# Patient Record
Sex: Male | Born: 1935 | Race: White | Hispanic: No | Marital: Married | Smoking: Never smoker
Health system: Southern US, Community
[De-identification: ages and names within clinical notes are randomized; demographics above are authoritative.]

## PROBLEM LIST (undated history)

## (undated) DIAGNOSIS — Z87448 Personal history of other diseases of urinary system: Secondary | ICD-10-CM

## (undated) DIAGNOSIS — I10 Essential (primary) hypertension: Secondary | ICD-10-CM

## (undated) DIAGNOSIS — N400 Enlarged prostate without lower urinary tract symptoms: Secondary | ICD-10-CM

## (undated) DIAGNOSIS — Z87898 Personal history of other specified conditions: Secondary | ICD-10-CM

## (undated) HISTORY — PX: TONSILLECTOMY: SUR1361

---

## 1975-07-11 HISTORY — PX: INGUINAL HERNIA REPAIR: SUR1180

## 2011-11-08 DIAGNOSIS — R7309 Other abnormal glucose: Secondary | ICD-10-CM | POA: Diagnosis not present

## 2011-11-08 DIAGNOSIS — R030 Elevated blood-pressure reading, without diagnosis of hypertension: Secondary | ICD-10-CM | POA: Diagnosis not present

## 2011-11-08 DIAGNOSIS — E785 Hyperlipidemia, unspecified: Secondary | ICD-10-CM | POA: Diagnosis not present

## 2011-11-08 DIAGNOSIS — N401 Enlarged prostate with lower urinary tract symptoms: Secondary | ICD-10-CM | POA: Diagnosis not present

## 2011-11-08 DIAGNOSIS — R7301 Impaired fasting glucose: Secondary | ICD-10-CM | POA: Diagnosis not present

## 2011-11-08 DIAGNOSIS — K219 Gastro-esophageal reflux disease without esophagitis: Secondary | ICD-10-CM | POA: Diagnosis not present

## 2012-04-01 DIAGNOSIS — R404 Transient alteration of awareness: Secondary | ICD-10-CM | POA: Diagnosis not present

## 2012-04-01 DIAGNOSIS — R42 Dizziness and giddiness: Secondary | ICD-10-CM | POA: Diagnosis not present

## 2012-04-01 DIAGNOSIS — I6200 Nontraumatic subdural hemorrhage, unspecified: Secondary | ICD-10-CM | POA: Diagnosis not present

## 2012-04-01 DIAGNOSIS — N400 Enlarged prostate without lower urinary tract symptoms: Secondary | ICD-10-CM | POA: Diagnosis present

## 2012-04-01 DIAGNOSIS — I498 Other specified cardiac arrhythmias: Secondary | ICD-10-CM | POA: Diagnosis present

## 2012-04-01 DIAGNOSIS — R5381 Other malaise: Secondary | ICD-10-CM | POA: Diagnosis not present

## 2012-04-01 DIAGNOSIS — R55 Syncope and collapse: Secondary | ICD-10-CM | POA: Diagnosis not present

## 2012-04-01 DIAGNOSIS — M6281 Muscle weakness (generalized): Secondary | ICD-10-CM

## 2012-04-01 DIAGNOSIS — Z2821 Immunization not carried out because of patient refusal: Secondary | ICD-10-CM | POA: Diagnosis not present

## 2012-04-01 DIAGNOSIS — E7800 Pure hypercholesterolemia, unspecified: Secondary | ICD-10-CM | POA: Diagnosis present

## 2012-04-01 DIAGNOSIS — S065X0A Traumatic subdural hemorrhage without loss of consciousness, initial encounter: Secondary | ICD-10-CM | POA: Diagnosis not present

## 2012-04-01 DIAGNOSIS — Z79899 Other long term (current) drug therapy: Secondary | ICD-10-CM | POA: Diagnosis not present

## 2012-04-02 DIAGNOSIS — R55 Syncope and collapse: Secondary | ICD-10-CM

## 2012-04-09 DIAGNOSIS — R300 Dysuria: Secondary | ICD-10-CM | POA: Diagnosis not present

## 2012-04-09 DIAGNOSIS — I951 Orthostatic hypotension: Secondary | ICD-10-CM | POA: Diagnosis not present

## 2012-04-09 DIAGNOSIS — N401 Enlarged prostate with lower urinary tract symptoms: Secondary | ICD-10-CM | POA: Diagnosis not present

## 2012-04-09 DIAGNOSIS — R55 Syncope and collapse: Secondary | ICD-10-CM | POA: Diagnosis not present

## 2012-04-15 ENCOUNTER — Encounter: Payer: Self-pay | Admitting: Internal Medicine

## 2012-04-15 ENCOUNTER — Ambulatory Visit (INDEPENDENT_AMBULATORY_CARE_PROVIDER_SITE_OTHER): Payer: Medicare Other | Admitting: Internal Medicine

## 2012-04-15 VITALS — BP 150/72 | HR 50 | Ht 72.0 in | Wt 237.1 lb

## 2012-04-15 DIAGNOSIS — I498 Other specified cardiac arrhythmias: Secondary | ICD-10-CM

## 2012-04-15 DIAGNOSIS — R001 Bradycardia, unspecified: Secondary | ICD-10-CM

## 2012-04-15 DIAGNOSIS — R55 Syncope and collapse: Secondary | ICD-10-CM

## 2012-04-15 NOTE — Patient Instructions (Addendum)
Your physician recommends that you schedule a follow-up appointment in: As needed  

## 2012-04-15 NOTE — Progress Notes (Signed)
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   Worked for 3 hours  Felt OK  Came in house  Showered.  Felt OK  Went to [facility] down  Sat down for about 1 hour.  Watched TV.  Stood up  Didn't feel dizzy.  Foot started shaking (R)  Moved up both legs  upt to top of head.  Felt like energy, strenght went out of body.  Next thing found himself leaning on kitchen table sliding off.  When woke up again was flat on floor.  Breathing OK  No nausea.  Call 911. EMS sat him up  Head started spinning.  Went away.  Stood  Did again, not as severe as first time. Went to ER  Stayed until wed.  Echo done  OK R/O for MI  Stopped Flomax.  Walked halls  Not dizzy.  Switched to proscar    Since left hosp felt a little weak  Walking per day  Felt a little swimmy headed   Last week wed went out  Had red meat for dinner.  Came home.  Gassy after  Took pepcid  No relief.  Stomach bloated.  Took peragoric  Gas went away  Rash developed Did have a rash last week Wed.   Itchy  Now gone.  Still swimmy headed when quit exercising.  NOt when exercising.  Stopped Proscar last week.  Has not been swimmy headed since.     No Known Allergies  Current Outpatient Prescriptions  Medication Sig Dispense Refill  . finasteride (PROSCAR) 5 MG tablet Take 5 mg by mouth daily.      . pravastatin (PRAVACHOL) 20 MG tablet Take 20 mg by mouth daily.        No past medical history on file.  No past surgical history on file.  No family history on file.  History   Social History  . Marital Status: Married    Spouse Name: N/A    Number of Children: N/A  . Years of Education: N/A   Occupational History  . Not on file.   Social History Main Topics  . Smoking status: Never Smoker   . Smokeless tobacco: Not on file  . Alcohol Use: Not on file  . Drug Use: Not on file  . Sexually Active: Not on file   Other Topics Concern  . Not on file   Social  History Narrative  . No narrative on file    Review of Systems:  All systems reviewed.  They are negative to the above problem except as previously stated.  Vital Signs: BP 150/72  Pulse 50  Ht 6' (1.829 m)  Wt 237 lb 1.9 oz (107.557 kg)  BMI 32.16 kg/m2  Physical Exam Patient is in NAD HEENT:  Normocephalic, atraumatic. EOMI, PERRLA.  Neck: JVP is normal.  No bruits.  Lungs: clear to auscultation. No rales no wheezes.  Heart: Regular rate and rhythm. Normal S1, S2. No S3.   No significant murmurs. PMI not displaced.  Abdomen:  Supple, nontender. Normal bowel sounds. No masses. No hepatomegaly.  Extremities:   Good distal pulses throughout. No lower extremity edema.  Musculoskeletal :moving all extremities.  Neuro:   alert and oriented x3.  CN II-XII grossly intact.  EKG:  Sinus bradycardia  50 bpm Assessment and Plan:  1.  Syncope.  Episode of syncope a couple wks ago.  Sounds like  it may have been do to a little dehydration with orthostasis (post shower, sitting) in setting of Flomax.  Felt a little swimmy on Proscar.  None since off meds.  Plans to rechallange tomorrow  I am not convinced of symptomatic bradycardia.  He was on tele at [facility] and there is no mention in note.  Told him to be careful with rechallange of Proscar.  He may need to see urology.  I would not plan further cardiac testing unless symptoms recur off meds.  Will be available prn.

## 2012-05-15 DIAGNOSIS — N401 Enlarged prostate with lower urinary tract symptoms: Secondary | ICD-10-CM | POA: Diagnosis not present

## 2012-05-15 DIAGNOSIS — R7309 Other abnormal glucose: Secondary | ICD-10-CM | POA: Diagnosis not present

## 2012-05-15 DIAGNOSIS — N509 Disorder of male genital organs, unspecified: Secondary | ICD-10-CM | POA: Diagnosis not present

## 2012-05-15 DIAGNOSIS — I10 Essential (primary) hypertension: Secondary | ICD-10-CM | POA: Diagnosis not present

## 2012-05-15 DIAGNOSIS — E785 Hyperlipidemia, unspecified: Secondary | ICD-10-CM | POA: Diagnosis not present

## 2012-05-15 DIAGNOSIS — E782 Mixed hyperlipidemia: Secondary | ICD-10-CM | POA: Diagnosis not present

## 2012-05-16 DIAGNOSIS — R030 Elevated blood-pressure reading, without diagnosis of hypertension: Secondary | ICD-10-CM | POA: Diagnosis not present

## 2012-05-16 DIAGNOSIS — E669 Obesity, unspecified: Secondary | ICD-10-CM | POA: Diagnosis not present

## 2012-05-16 DIAGNOSIS — Z0000 Encounter for general adult medical examination without abnormal findings: Secondary | ICD-10-CM | POA: Diagnosis not present

## 2012-05-16 DIAGNOSIS — K219 Gastro-esophageal reflux disease without esophagitis: Secondary | ICD-10-CM | POA: Diagnosis not present

## 2012-06-18 DIAGNOSIS — L500 Allergic urticaria: Secondary | ICD-10-CM | POA: Diagnosis not present

## 2012-06-18 DIAGNOSIS — Z79899 Other long term (current) drug therapy: Secondary | ICD-10-CM | POA: Diagnosis not present

## 2012-06-18 DIAGNOSIS — L259 Unspecified contact dermatitis, unspecified cause: Secondary | ICD-10-CM | POA: Diagnosis not present

## 2012-07-10 DIAGNOSIS — N400 Enlarged prostate without lower urinary tract symptoms: Secondary | ICD-10-CM

## 2012-07-10 HISTORY — DX: Benign prostatic hyperplasia without lower urinary tract symptoms: N40.0

## 2012-09-11 DIAGNOSIS — N400 Enlarged prostate without lower urinary tract symptoms: Secondary | ICD-10-CM | POA: Diagnosis not present

## 2012-09-11 DIAGNOSIS — I10 Essential (primary) hypertension: Secondary | ICD-10-CM | POA: Diagnosis not present

## 2012-09-11 DIAGNOSIS — E785 Hyperlipidemia, unspecified: Secondary | ICD-10-CM | POA: Diagnosis not present

## 2012-09-11 DIAGNOSIS — E782 Mixed hyperlipidemia: Secondary | ICD-10-CM | POA: Diagnosis not present

## 2013-03-19 DIAGNOSIS — N401 Enlarged prostate with lower urinary tract symptoms: Secondary | ICD-10-CM | POA: Diagnosis not present

## 2013-03-19 DIAGNOSIS — Z23 Encounter for immunization: Secondary | ICD-10-CM | POA: Diagnosis not present

## 2013-03-19 DIAGNOSIS — E785 Hyperlipidemia, unspecified: Secondary | ICD-10-CM | POA: Diagnosis not present

## 2013-03-19 DIAGNOSIS — R7309 Other abnormal glucose: Secondary | ICD-10-CM | POA: Diagnosis not present

## 2013-03-19 DIAGNOSIS — E782 Mixed hyperlipidemia: Secondary | ICD-10-CM | POA: Diagnosis not present

## 2013-03-19 DIAGNOSIS — I10 Essential (primary) hypertension: Secondary | ICD-10-CM | POA: Diagnosis not present

## 2014-02-09 DIAGNOSIS — R7301 Impaired fasting glucose: Secondary | ICD-10-CM | POA: Diagnosis not present

## 2014-02-09 DIAGNOSIS — R7309 Other abnormal glucose: Secondary | ICD-10-CM | POA: Diagnosis not present

## 2014-02-09 DIAGNOSIS — E785 Hyperlipidemia, unspecified: Secondary | ICD-10-CM | POA: Diagnosis not present

## 2014-02-09 DIAGNOSIS — E782 Mixed hyperlipidemia: Secondary | ICD-10-CM | POA: Diagnosis not present

## 2014-02-09 DIAGNOSIS — M62838 Other muscle spasm: Secondary | ICD-10-CM | POA: Diagnosis not present

## 2014-02-09 DIAGNOSIS — N508 Other specified disorders of male genital organs: Secondary | ICD-10-CM | POA: Diagnosis not present

## 2014-02-10 ENCOUNTER — Other Ambulatory Visit (HOSPITAL_COMMUNITY): Payer: Self-pay | Admitting: Internal Medicine

## 2014-02-10 DIAGNOSIS — N5089 Other specified disorders of the male genital organs: Secondary | ICD-10-CM

## 2014-02-11 ENCOUNTER — Ambulatory Visit (HOSPITAL_COMMUNITY)
Admission: RE | Admit: 2014-02-11 | Discharge: 2014-02-11 | Disposition: A | Discharge status: Home / Self Care | Payer: Medicare Other | Source: Ambulatory Visit | Attending: Internal Medicine | Admitting: Internal Medicine

## 2014-02-11 ENCOUNTER — Other Ambulatory Visit (HOSPITAL_COMMUNITY): Payer: Self-pay | Admitting: Internal Medicine

## 2014-02-11 DIAGNOSIS — N508 Other specified disorders of male genital organs: Secondary | ICD-10-CM | POA: Insufficient documentation

## 2014-02-11 DIAGNOSIS — N5089 Other specified disorders of the male genital organs: Secondary | ICD-10-CM

## 2014-02-11 DIAGNOSIS — N433 Hydrocele, unspecified: Secondary | ICD-10-CM | POA: Insufficient documentation

## 2014-04-07 ENCOUNTER — Ambulatory Visit (INDEPENDENT_AMBULATORY_CARE_PROVIDER_SITE_OTHER): Payer: Medicare Other | Admitting: Urology

## 2014-04-07 DIAGNOSIS — N433 Hydrocele, unspecified: Secondary | ICD-10-CM | POA: Diagnosis not present

## 2014-04-07 DIAGNOSIS — N401 Enlarged prostate with lower urinary tract symptoms: Secondary | ICD-10-CM | POA: Diagnosis not present

## 2014-04-07 DIAGNOSIS — N138 Other obstructive and reflux uropathy: Secondary | ICD-10-CM

## 2014-04-09 ENCOUNTER — Other Ambulatory Visit: Payer: Self-pay | Admitting: Urology

## 2014-04-15 DIAGNOSIS — Z23 Encounter for immunization: Secondary | ICD-10-CM | POA: Diagnosis not present

## 2014-04-17 ENCOUNTER — Encounter (HOSPITAL_COMMUNITY): Payer: Self-pay | Admitting: Pharmacy Technician

## 2014-04-30 ENCOUNTER — Encounter (HOSPITAL_COMMUNITY)
Admission: RE | Admit: 2014-04-30 | Discharge: 2014-04-30 | Disposition: A | Discharge status: Home / Self Care | Payer: Medicare Other | Source: Ambulatory Visit | Attending: Urology | Admitting: Urology

## 2014-04-30 ENCOUNTER — Encounter (HOSPITAL_COMMUNITY): Payer: Self-pay

## 2014-04-30 ENCOUNTER — Other Ambulatory Visit: Payer: Self-pay

## 2014-04-30 DIAGNOSIS — Z01818 Encounter for other preprocedural examination: Secondary | ICD-10-CM | POA: Diagnosis present

## 2014-04-30 HISTORY — DX: Personal history of other diseases of urinary system: Z87.448

## 2014-04-30 HISTORY — DX: Essential (primary) hypertension: I10

## 2014-04-30 HISTORY — DX: Personal history of other specified conditions: Z87.898

## 2014-04-30 HISTORY — DX: Benign prostatic hyperplasia without lower urinary tract symptoms: N40.0

## 2014-04-30 LAB — HEMOGLOBIN AND HEMATOCRIT, BLOOD
HEMATOCRIT: 41.1 % (ref 39.0–52.0)
Hemoglobin: 14.3 g/dL (ref 13.0–17.0)

## 2014-04-30 LAB — BASIC METABOLIC PANEL
ANION GAP: 11 (ref 5–15)
BUN: 11 mg/dL (ref 6–23)
CALCIUM: 9 mg/dL (ref 8.4–10.5)
CO2: 26 mEq/L (ref 19–32)
Chloride: 104 mEq/L (ref 96–112)
Creatinine, Ser: 0.98 mg/dL (ref 0.50–1.35)
GFR calc Af Amer: 90 mL/min — ABNORMAL LOW (ref >90)
GFR calc non Af Amer: 77 mL/min — ABNORMAL LOW (ref >90)
GLUCOSE: 126 mg/dL — AB (ref 70–99)
Potassium: 4.2 mEq/L (ref 3.7–5.3)
Sodium: 141 mEq/L (ref 137–147)

## 2014-04-30 NOTE — Patient Instructions (Signed)
Adam Banks  04/30/2014   Your procedure is scheduled on:   05/05/2014  Report to [facility] at  57  AM.  Call this number if you have problems the morning of surgery: [phone]   Remember:   Do not eat food or drink liquids after midnight.   Take these medicines the morning of surgery with A SIP OF WATER: proscar   Do not wear jewelry, make-up or nail polish.  Do not wear lotions, powders, or perfumes.   Do not shave 48 hours prior to surgery. Men may shave face and neck.  Do not bring valuables to the hospital.  [facility] is not responsible for any belongings or valuables.               Contacts, dentures or bridgework may not be worn into surgery.  Leave suitcase in the car. After surgery it may be brought to your room.  For patients admitted to the hospital, discharge time is determined by your treatment team.               Patients discharged the day of surgery will not be allowed to drive home.  Name and phone number of your driver: family  Special Instructions: Shower using CHG 2 nights before surgery and the night before surgery.  If you shower the day of surgery use CHG.  Use special wash - you have one bottle of CHG for all showers.  You should use approximately 1/3 of the bottle for each shower.   Please read over the following fact sheets that you were given: Pain Booklet, Coughing and Deep Breathing, Surgical Site Infection Prevention, Anesthesia Post-op Instructions and Care and Recovery After Surgery Hydrocele, Adult Fluid can collect around the testicles. This fluid forms in a sac. This condition is called a hydrocele. The collected fluid causes swelling of the scrotum. Usually, it affects just one testicle. Most of the time, the condition does not cause pain. Sometimes, the hydrocele goes away on its own. Other times, surgery is needed to get rid of the fluid. CAUSES A hydrocele does not develop often. Different things can cause a hydrocele in a man,  including:  Injury to the scrotum.  Infection.  X-ray of the area around the scrotum.  A tumor or cancer of the testicle.  Twisting of a testicle.  Decreased blood flow to the scrotum. SYMPTOMS   Swelling without pain. The hydrocele feels like a water-filled balloon.  Swelling with pain. This can occur if the hydrocele was caused by infection or twisting.  Mild discomfort in the scrotum.  The hydrocele may feel heavy.  Swelling that gets smaller when you lie down. DIAGNOSIS  Your caregiver will do a physical exam to decide if you have a hydrocele. This may include:  Asking questions about your overall health, today and in the past. Your caregiver may ask about any injuries, X-rays, or infections.  Pushing on your abdomen or asking you to change positions to see if the size of the hydrocele changes.  Shining a light through the scrotum (transillumination) to see if the fluid inside the scrotum is clear.  Blood tests and urine tests to check for infection.  Imaging studies that take pictures of the scrotum and testicles. TREATMENT  Treatment depends in part on what caused the condition. Options include:  Watchful waiting. Your caregiver checks the hydrocele every so often.  Different surgeries to drain the fluid.  A needle may be put  into the scrotum to drain fluid (needle aspiration). Fluid often returns after this type of treatment.  A cut (incision) may be made in the scrotum to remove the fluid sac (hydrocelectomy).  An incision may be made in the groin to repair a hydrocele that has contact with abdominal fluids (communicating hydrocele).  Medicines to treat an infection (antibiotics). HOME CARE INSTRUCTIONS  What you need to do at home may depend on the cause of the hydrocele and type of treatment. In general:  Take all medicine as directed by your caregiver. Follow the directions carefully.  Ask your caregiver if there is anything you should not do while  you recover (activities, lifting, work, sex).  If you had surgery to repair a communicating hydrocele, recovery time may vary. Ask you caregiver about your recovery time.  Avoid heavy lifting for 4 to 6 weeks.  If you had an incision on the scrotum or groin, wash it for 2 to 3 days after surgery. Do this as long as the skin is closed and there are no gaps in the wound. Wash gently, and avoid rubbing the incision.  Keep all follow-up appointments. SEEK MEDICAL CARE IF:   Your scrotum seems to be getting larger.  The area becomes more and more uncomfortable. SEEK IMMEDIATE MEDICAL CARE IF:  You have a fever. Document Released: 12/14/2009 Document Revised: 04/16/2013 Document Reviewed: 12/14/2009 [facility] Patient Information 2015 [place], [place]. This information is not intended to replace advice given to you by your health care provider. Make sure you discuss any questions you have with your health care provider. PATIENT INSTRUCTIONS POST-ANESTHESIA  IMMEDIATELY FOLLOWING SURGERY:  Do not drive or operate machinery for the first twenty four hours after surgery.  Do not make any important decisions for twenty four hours after surgery or while taking narcotic pain medications or sedatives.  If you develop intractable nausea and vomiting or a severe headache please notify your doctor immediately.  FOLLOW-UP:  Please make an appointment with your surgeon as instructed. You do not need to follow up with anesthesia unless specifically instructed to do so.  WOUND CARE INSTRUCTIONS (if applicable):  Keep a dry clean dressing on the anesthesia/puncture wound site if there is drainage.  Once the wound has quit draining you may leave it open to air.  Generally you should leave the bandage intact for twenty four hours unless there is drainage.  If the epidural site drains for more than 36-48 hours please call the anesthesia department.  QUESTIONS?:  Please feel free to call your physician or the  hospital operator if you have any questions, and they will be happy to assist you.

## 2014-05-05 ENCOUNTER — Ambulatory Visit (HOSPITAL_COMMUNITY)
Admission: RE | Admit: 2014-05-05 | Discharge: 2014-05-05 | Disposition: A | Discharge status: Home / Self Care | Payer: Medicare Other | Source: Ambulatory Visit | Attending: Urology | Admitting: Urology

## 2014-05-05 ENCOUNTER — Encounter (HOSPITAL_COMMUNITY)
Admission: RE | Disposition: A | Discharge status: Home / Self Care | Payer: Self-pay | Source: Ambulatory Visit | Attending: Urology

## 2014-05-05 ENCOUNTER — Ambulatory Visit (HOSPITAL_COMMUNITY): Payer: Medicare Other | Admitting: Anesthesiology

## 2014-05-05 ENCOUNTER — Encounter (HOSPITAL_COMMUNITY): Payer: Self-pay | Admitting: *Deleted

## 2014-05-05 ENCOUNTER — Encounter (HOSPITAL_COMMUNITY): Payer: Medicare Other | Admitting: Anesthesiology

## 2014-05-05 DIAGNOSIS — Z79899 Other long term (current) drug therapy: Secondary | ICD-10-CM | POA: Insufficient documentation

## 2014-05-05 DIAGNOSIS — N433 Hydrocele, unspecified: Secondary | ICD-10-CM | POA: Insufficient documentation

## 2014-05-05 DIAGNOSIS — I10 Essential (primary) hypertension: Secondary | ICD-10-CM | POA: Diagnosis not present

## 2014-05-05 HISTORY — PX: HYDROCELE EXCISION: SHX482

## 2014-05-05 SURGERY — HYDROCELECTOMY
Anesthesia: General | Laterality: Left

## 2014-05-05 MED ORDER — SUCCINYLCHOLINE CHLORIDE 20 MG/ML IJ SOLN
INTRAMUSCULAR | Status: DC | PRN
  Administered 2014-05-05: 180 mg via INTRAVENOUS

## 2014-05-05 MED ORDER — GLYCOPYRROLATE 0.2 MG/ML IJ SOLN
INTRAMUSCULAR | Status: AC
  Filled 2014-05-05: qty 1

## 2014-05-05 MED ORDER — GLYCOPYRROLATE 0.2 MG/ML IJ SOLN
INTRAMUSCULAR | Status: DC | PRN
  Administered 2014-05-05: 0.2 mg via INTRAVENOUS

## 2014-05-05 MED ORDER — BUPIVACAINE HCL (PF) 0.25 % IJ SOLN
INTRAMUSCULAR | Status: AC
  Filled 2014-05-05: qty 30

## 2014-05-05 MED ORDER — MIDAZOLAM HCL 2 MG/2ML IJ SOLN
INTRAMUSCULAR | Status: AC
  Filled 2014-05-05: qty 2

## 2014-05-05 MED ORDER — SUCCINYLCHOLINE CHLORIDE 20 MG/ML IJ SOLN
INTRAMUSCULAR | Status: AC
Start: 2014-05-05 — End: 2014-05-05
  Filled 2014-05-05: qty 1

## 2014-05-05 MED ORDER — ONDANSETRON HCL 4 MG/2ML IJ SOLN: 4.0000 mg | Freq: Once | INTRAMUSCULAR | Status: DC | PRN

## 2014-05-05 MED ORDER — LACTATED RINGERS IV SOLN
INTRAVENOUS | Status: DC
  Administered 2014-05-05: 12:00:00 via INTRAVENOUS

## 2014-05-05 MED ORDER — HYDROCODONE-ACETAMINOPHEN 5-325 MG PO TABS: 1.0000 | ORAL_TABLET | ORAL | Status: DC | PRN

## 2014-05-05 MED ORDER — CEFAZOLIN SODIUM 1-5 GM-% IV SOLN
INTRAVENOUS | Status: AC
  Filled 2014-05-05: qty 50

## 2014-05-05 MED ORDER — PROPOFOL 10 MG/ML IV EMUL
INTRAVENOUS | Status: AC
  Filled 2014-05-05: qty 20

## 2014-05-05 MED ORDER — FENTANYL CITRATE 0.05 MG/ML IJ SOLN: 25.0000 ug | INTRAMUSCULAR | Status: DC | PRN

## 2014-05-05 MED ORDER — MIDAZOLAM HCL 5 MG/5ML IJ SOLN
INTRAMUSCULAR | Status: DC | PRN
  Administered 2014-05-05 (×2): 1 mg via INTRAVENOUS

## 2014-05-05 MED ORDER — BUPIVACAINE HCL (PF) 0.25 % IJ SOLN
INTRAMUSCULAR | Status: DC | PRN
  Administered 2014-05-05: 9 mL

## 2014-05-05 MED ORDER — 0.9 % SODIUM CHLORIDE (POUR BTL) OPTIME
TOPICAL | Status: DC | PRN
  Administered 2014-05-05: 1000 mL

## 2014-05-05 MED ORDER — PROPOFOL 10 MG/ML IV BOLUS
INTRAVENOUS | Status: DC | PRN
  Administered 2014-05-05: 50 mg via INTRAVENOUS
  Administered 2014-05-05: 200 mg via INTRAVENOUS

## 2014-05-05 MED ORDER — FENTANYL CITRATE 0.05 MG/ML IJ SOLN
INTRAMUSCULAR | Status: DC | PRN
  Administered 2014-05-05: 25 ug via INTRAVENOUS
  Administered 2014-05-05: 75 ug via INTRAVENOUS

## 2014-05-05 MED ORDER — FENTANYL CITRATE 0.05 MG/ML IJ SOLN
INTRAMUSCULAR | Status: AC
  Filled 2014-05-05: qty 2

## 2014-05-05 MED ORDER — LIDOCAINE HCL 1 % IJ SOLN
INTRAMUSCULAR | Status: DC | PRN
  Administered 2014-05-05: 50 mg via INTRADERMAL

## 2014-05-05 MED ORDER — MIDAZOLAM HCL 2 MG/2ML IJ SOLN
1.0000 mg | INTRAMUSCULAR | Status: DC | PRN
  Administered 2014-05-05: 2 mg via INTRAVENOUS

## 2014-05-05 MED ORDER — CEFAZOLIN SODIUM 1-5 GM-% IV SOLN
1.0000 g | INTRAVENOUS | Status: AC
  Administered 2014-05-05: 1 g via INTRAVENOUS

## 2014-05-05 MED ORDER — ONDANSETRON HCL 4 MG/2ML IJ SOLN
INTRAMUSCULAR | Status: AC
  Filled 2014-05-05: qty 2

## 2014-05-05 MED ORDER — FENTANYL CITRATE 0.05 MG/ML IJ SOLN
25.0000 ug | INTRAMUSCULAR | Status: AC
  Administered 2014-05-05: 25 ug via INTRAVENOUS

## 2014-05-05 MED ORDER — ONDANSETRON HCL 4 MG/2ML IJ SOLN
4.0000 mg | Freq: Once | INTRAMUSCULAR | Status: AC
Start: 2014-05-05 — End: 2014-05-05
  Administered 2014-05-05: 4 mg via INTRAVENOUS

## 2014-05-05 SURGICAL SUPPLY — 35 items
BAG HAMPER (MISCELLANEOUS) ×3 IMPLANT
BANDAGE GAUZE ELAST BULKY 4 IN (GAUZE/BANDAGES/DRESSINGS) ×3 IMPLANT
BENZOIN TINCTURE PRP APPL 2/3 (GAUZE/BANDAGES/DRESSINGS) IMPLANT
BNDG GAUZE ELAST 4 BULKY (GAUZE/BANDAGES/DRESSINGS) ×3 IMPLANT
COVER LIGHT HANDLE STERIS (MISCELLANEOUS) ×3 IMPLANT
DECANTER SPIKE VIAL GLASS SM (MISCELLANEOUS) ×3 IMPLANT
DRAIN PENROSE 12X.25 LTX STRL (MISCELLANEOUS) ×3 IMPLANT
ELECT NEEDLE TIP 2.8 STRL (NEEDLE) ×3 IMPLANT
ELECT REM PT RETURN 9FT ADLT (ELECTROSURGICAL) ×3
ELECTRODE REM PT RTRN 9FT ADLT (ELECTROSURGICAL) ×1 IMPLANT
GLOVE BIO SURGEON STRL SZ 6.5 (GLOVE) ×4 IMPLANT
GLOVE BIO SURGEONS STRL SZ 6.5 (GLOVE) ×2
GLOVE BIOGEL M 8.0 STRL (GLOVE) ×3 IMPLANT
GLOVE BIOGEL PI IND STRL 7.0 (GLOVE) ×2 IMPLANT
GLOVE BIOGEL PI IND STRL 7.5 (GLOVE) ×1 IMPLANT
GLOVE BIOGEL PI INDICATOR 7.0 (GLOVE) ×4
GLOVE BIOGEL PI INDICATOR 7.5 (GLOVE) ×2
GOWN STRL REUS W/ TWL LRG LVL3 (GOWN DISPOSABLE) ×2 IMPLANT
GOWN STRL REUS W/TWL LRG LVL3 (GOWN DISPOSABLE) ×4
GOWN STRL REUS W/TWL XL LVL3 (GOWN DISPOSABLE) ×3 IMPLANT
INST SET MINOR GENERAL (KITS) ×3 IMPLANT
KIT ROOM TURNOVER APOR (KITS) ×3 IMPLANT
NEEDLE HYPO 22GX1.5 SAFETY (NEEDLE) IMPLANT
NS IRRIG 1000ML POUR BTL (IV SOLUTION) ×3 IMPLANT
PACK MINOR (CUSTOM PROCEDURE TRAY) ×3 IMPLANT
SET BASIN LINEN APH (SET/KITS/TRAYS/PACK) ×6 IMPLANT
SUPPORT SCROTAL LG STRP (MISCELLANEOUS) ×2 IMPLANT
SUPPORT SCROTAL MED ADLT STRP (MISCELLANEOUS) ×2 IMPLANT
SUPPORTER ATHLETIC LG (MISCELLANEOUS) ×1
SUPPORTER ATHLETIC MED (MISCELLANEOUS) ×1
SUT CHROMIC 2 0 SH (SUTURE) IMPLANT
SUT CHROMIC 3 0 SH 27 (SUTURE) ×6 IMPLANT
SUT CHROMIC BN 1/2CIR 4-0 27IN (SUTURE) ×3 IMPLANT
SYR BULB IRRIGATION 50ML (SYRINGE) ×3 IMPLANT
SYRINGE CONTROL L 12CC (SYRINGE) ×3 IMPLANT

## 2014-05-05 NOTE — Anesthesia Procedure Notes (Addendum)
Procedure Name: LMA Insertion Date/Time: 05/05/2014 12:10 PM Performed by: Charmaine Downs Pre-anesthesia Checklist: Patient identified, Emergency Drugs available, Suction available and Patient being monitored Patient Re-evaluated:Patient Re-evaluated prior to inductionOxygen Delivery Method: Circle system utilized Preoxygenation: Pre-oxygenation with 100% oxygen Intubation Type: IV induction Ventilation: Mask ventilation without difficulty LMA: LMA inserted LMA Size: 5.0 Grade View: Grade III Tube type: Oral Number of attempts: 1 Dental Injury: Teeth and Oropharynx as per pre-operative assessment  Difficulty Due To: Difficulty was unanticipated    Performed by: Bernette Redbird J    Procedure Name: Intubation Date/Time: 05/05/2014 12:16 PM Performed by: Charmaine Downs Pre-anesthesia Checklist: Emergency Drugs available, Patient identified, Suction available and Patient being monitored Oxygen Delivery Method: Circle system utilized Preoxygenation: Pre-oxygenation with 100% oxygen Intubation Type: Rapid sequence and Cricoid Pressure applied Laryngoscope Size: Mac and 3 Grade View: Grade III Tube type: Oral Tube size: 8.0 mm Number of attempts: 1 Airway Equipment and Method: Stylet Placement Confirmation: ETT inserted through vocal cords under direct vision,  positive ETCO2 and breath sounds checked- equal and bilateral Secured at: 22 cm Dental Injury: Teeth and Oropharynx as per pre-operative assessment

## 2014-05-05 NOTE — Anesthesia Postprocedure Evaluation (Signed)
  Anesthesia Post-op Note  Patient: Adam Banks  Procedure(s) Performed: Procedure(s): LEFT HYDROCELECTOMY ADULT (Left)  Patient Location: PACU  Anesthesia Type:General  Level of Consciousness: awake, alert , oriented and patient cooperative  Airway and Oxygen Therapy: Patient Spontanous Breathing  Post-op Pain: none  Post-op Assessment: Post-op Vital signs reviewed, Patient's Cardiovascular Status Stable, Respiratory Function Stable, Patent Airway and Pain level controlled  Post-op Vital Signs: Reviewed and stable  Last Vitals:  Filed Vitals:   05/05/14 1315  BP: 166/99  Pulse: 64  Temp:   Resp: 19    Complications: No apparent anesthesia complications

## 2014-05-05 NOTE — Discharge Instructions (Signed)
Scrotal surgery postoperative instructions  Wound:  In most cases your incision will have absorbable sutures that will dissolve within the first 2-3 weeks. Some will fall out even earlier. Expect some redness as the sutures dissolve but this should occur only around the sutures. If there is generalized redness, especially with increasing pain or swelling, let us know. The scrotum will very likely get "black and blue" as the blood in the tissues spread. Sometimes the whole scrotum will turn colors. The black and blue is followed by a yellow and brown color. In time, all the discoloration will go away. In some cases some firm swelling in the area of the testicle may persist for up to 4-6 weeks after the surgery and is considered normal in most cases.  Drain:  If the surgeon placed a drain through the bottom part of your scrotum, it is held in with a small stitch. When instructed, cut the small stitch and slide to drain out. It is okay to remove the drain next Monday or Tuesday. Once the drain has been removed, a small hole made drain out for another day or 2. If so, keep a clean washcloth underneath your supportive undergarment, or sterile gauze. Until the hole seals up, all bathing should be in the shower, and not in the bathtub.  Diet:  You may return to your normal diet within 24 hours following your surgery. You may note some mild nausea and possibly vomiting the first 6-8 hours following surgery. This is usually due to the side effects of anesthesia, and will disappear quite soon. I would suggest clear liquids and a very light meal the first evening following your surgery.  Activity:  Your physical activity should be restricted the first 48 hours. During that time you should remain relatively inactive, moving about only when necessary. During the first 7-10 days following surgery you should avoid lifting any heavy objects (anything greater than 15 pounds), and avoid strenuous exercise. If you  work, ask [place] specifically about your restrictions, both for work and home. We will write a note to your employer if needed.  You should plan to wear a tight pair of jockey shorts or an athletic supporter for the first 4-5 days, even to sleep. This will keep the scrotum immobilized to some degree and keep the swelling down. You may find it more comfortable to wear a support longer.  Ice packs should be placed on and off over the scrotum for the first 48 hours. Frozen peas or corn in a ZipLock bag can be frozen, used and re-frozen. Fifteen minutes on and 15 minutes off is a reasonable schedule. The ice is a good pain reliever and keeps the swelling down.  Hygiene:  You may shower 48 hours after your surgery. Tub bathing should be restricted until the seventh day.          Medication:  You will be sent home with some type of pain medication. In many cases you will be sent home with a narcotic pain pill (Vicodin or Tylox). If the pain is not too bad, you may take either Tylenol (acetaminophen) or Advil (ibuprofen) which contain no narcotic agents, and might be tolerated a little better, with fewer side effects. If the pain medication you are sent home with does not control the pain, you will have to let us know. Some narcotic pain medications cannot be given or refilled by a phone call to a pharmacy.  Problems you should report to [place]:   Fever  of 101.0 degrees Fahrenheit or greater.  Moderate or severe swelling under the skin incision or involving the scrotum.  Drug reaction such as hives, a rash, nausea or vomiting.

## 2014-05-05 NOTE — Transfer of Care (Signed)
Immediate Anesthesia Transfer of Care Note  Patient: Adam Banks  Procedure(s) Performed: Procedure(s): LEFT HYDROCELECTOMY ADULT (Left)  Patient Location: PACU  Anesthesia Type:General  Level of Consciousness: awake and patient cooperative  Airway & Oxygen Therapy: Patient Spontanous Breathing and Patient connected to face mask oxygen  Post-op Assessment: Report given to PACU RN, Post -op Vital signs reviewed and stable and Patient moving all extremities  Post vital signs: Reviewed and stable  Complications: No apparent anesthesia complications

## 2014-05-05 NOTE — H&P (Signed)
  Urology History and Physical Exam  CC: Left scrotal swelling  HPI: 79 year old male presents for surgical management of a symptomatic left hydrocele. This has been symptomatic for a couple of years. Ultrasound was done confirming a large left hydrocele and a normal appearing left testicle.  PMH: Past Medical History  Diagnosis Date  . Hypertension   . Enlarged prostate 2014  . History of urinary hesitancy     PSH: Past Surgical History  Procedure Laterality Date  . Tonsillectomy    . Inguinal hernia repair Right 1977    Allergies: Allergies  Allergen Reactions  . Flomax [Tamsulosin Hcl] Other (See Comments)    Patient states that he passed out.    Medications: No prescriptions prior to admission     Social History: History   Social History  . Marital Status: Married    Spouse Name: N/A    Number of Children: N/A  . Years of Education: N/A   Occupational History  . Not on file.   Social History Main Topics  . Smoking status: Never Smoker   . Smokeless tobacco: Not on file  . Alcohol Use: No  . Drug Use: No  . Sexual Activity: Not on file   Other Topics Concern  . Not on file   Social History Narrative  . No narrative on file    Family History: No family history on file.  Review of Systems: Positive: Left scrotal swelling, LUTS Negative:   A further 10 point review of systems was negative except what is listed in the HPI.                  Physical Exam: @VITALS2 @ General: No acute distress.  Awake. Head:  Normocephalic.  Atraumatic. ENT:  EOMI.  Mucous membranes moist Neck:  Supple.  No lymphadenopathy. CV:  S1 present. S2 present. Regular rate. Pulmonary: Equal effort bilaterally.  Clear to auscultation bilaterally. Abdomen: Soft.  Non tender to palpation. Skin:  Normal turgor.  No visible rash. Extremity: No gross deformity of bilateral upper extremities.  No gross deformity of                             lower  extremities. Neurologic: Alert. Appropriate mood.  Penis:   No lesions. Urethra: Orthotopic meatus. Scrotum: No lesions.  No ecchymosis.  No erythema. Large left hydrocele. Testicles: Right nml--left nonpalpable  No masses bilaterally. Epididymis: Palpable bilaterally. Nontender to palpation.  Studies:  No results found for this basename: HGB, WBC, PLT,  in the last 72 hours  No results found for this basename: NA, K, CL, CO2, BUN, CREATININE, CALCIUM, MAGNESIUM, GFRNONAA, GFRAA,  in the last 72 hours   No results found for this basename: PT, INR, APTT,  in the last 72 hours   No components found with this basename: ABG,     Assessment:  Symptomatic large left hydrocele.  Plan: Left hydrocelectomy

## 2014-05-05 NOTE — Op Note (Signed)
Preoperative diagnosis: Left hydrocele   Postoperative diagnosis: Same   Procedure: Left hydrocele repair   Surgeon: Lillette Boxer. Can Lucci, M.D.   Anesthesia: Gen.   Indications: Patient presented with scrotal swelling. A hydrocele was confirmed with scrotal ultrasonography. The patient was symptomatic from his hydrocele and requested surgical intervention. He appeared to understand the risks benefits potential complications of this procedure.   Procedure: The patient was properly identified and marked in the holding room. He received preoperative IV antibiotics. He was then taken to the operating room where general anesthetic was administered using the endotracheal tube. The scrotum was then prepped and draped in the usual manner. Appropriate surgical timeout was performed. An incision was made in the median raphae of the scrotum. The hydrocele was encountered which was freed from the scrotal wall and then the testis and hydrocele sac were delivered from the incision. The hydrocele sac was then incised anteriorly and a large amount of fluid was obtained. The testis was carefully inspected and no other pathology was appreciated. The hydrocele sac was moderate in thickness.  The sac was then plicated posteriorly with a running 2-0 chromic suture. The spermatic cord block was then performed with 10 mL of quarter percent Marcaine. The testis was returned to the hemiscrotum taking great care to make sure that there was no twisting of the spermatic cord. Inspection of the inside of the scrotum revealed no significant bleeding. A quarter-inch Penrose was brought through the lower aspect of the scrotum into the affected hemiscrotum and sutured to the skin. The scrotal incision was then closed anatomically, first with a running 3-0 chromic reapproximating the dartos fascia, and then a 4-0 chromic used to close the skin in a simple running fashion. A fluff dressing and jockstrap was then placed Sponge and needle  counts were correct. No obvious complications occurred and the patient was brought to recovery room in stable condition.

## 2014-05-05 NOTE — Anesthesia Preprocedure Evaluation (Addendum)
Anesthesia Evaluation  Patient identified by MRN, date of birth, ID band Patient awake    Reviewed: Allergy & Precautions, H&P , NPO status , Patient's Chart, lab work & pertinent test results  Airway Mallampati: III  TM Distance: >3 FB     Dental  (+) Teeth Intact   Pulmonary neg pulmonary ROS,  breath sounds clear to auscultation        Cardiovascular hypertension, Pt. on medications Rhythm:Regular Rate:Normal     Neuro/Psych    GI/Hepatic negative GI ROS,   Endo/Other    Renal/GU      Musculoskeletal   Abdominal   Peds  Hematology   Anesthesia Other Findings   Reproductive/Obstetrics                            Anesthesia Physical Anesthesia Plan  ASA: II  Anesthesia Plan: General   Post-op Pain Management:    Induction: Intravenous  Airway Management Planned: LMA  Additional Equipment:   Intra-op Plan:   Post-operative Plan: Extubation in OR  Informed Consent: I have reviewed the patients History and Physical, chart, labs and discussed the procedure including the risks, benefits and alternatives for the proposed anesthesia with the patient or authorized representative who has indicated his/her understanding and acceptance.     Plan Discussed with:   Anesthesia Plan Comments:         Anesthesia Quick Evaluation

## 2014-05-06 ENCOUNTER — Encounter (HOSPITAL_COMMUNITY): Payer: Self-pay | Admitting: Urology

## 2014-05-12 ENCOUNTER — Ambulatory Visit (INDEPENDENT_AMBULATORY_CARE_PROVIDER_SITE_OTHER): Payer: Self-pay | Admitting: Urology

## 2014-05-12 DIAGNOSIS — N433 Hydrocele, unspecified: Secondary | ICD-10-CM

## 2014-06-09 ENCOUNTER — Ambulatory Visit (INDEPENDENT_AMBULATORY_CARE_PROVIDER_SITE_OTHER): Payer: Self-pay | Admitting: Urology

## 2014-06-09 DIAGNOSIS — N433 Hydrocele, unspecified: Secondary | ICD-10-CM

## 2014-10-27 DIAGNOSIS — E782 Mixed hyperlipidemia: Secondary | ICD-10-CM | POA: Diagnosis not present

## 2014-10-27 DIAGNOSIS — R7301 Impaired fasting glucose: Secondary | ICD-10-CM | POA: Diagnosis not present

## 2014-10-27 DIAGNOSIS — E785 Hyperlipidemia, unspecified: Secondary | ICD-10-CM | POA: Diagnosis not present

## 2014-10-27 DIAGNOSIS — I861 Scrotal varices: Secondary | ICD-10-CM | POA: Diagnosis not present

## 2014-10-27 DIAGNOSIS — R42 Dizziness and giddiness: Secondary | ICD-10-CM | POA: Diagnosis not present

## 2015-04-27 DIAGNOSIS — E782 Mixed hyperlipidemia: Secondary | ICD-10-CM | POA: Diagnosis not present

## 2015-04-27 DIAGNOSIS — R7301 Impaired fasting glucose: Secondary | ICD-10-CM | POA: Diagnosis not present

## 2015-04-29 DIAGNOSIS — E782 Mixed hyperlipidemia: Secondary | ICD-10-CM | POA: Diagnosis not present

## 2015-04-29 DIAGNOSIS — I10 Essential (primary) hypertension: Secondary | ICD-10-CM | POA: Diagnosis not present

## 2015-04-29 DIAGNOSIS — R7301 Impaired fasting glucose: Secondary | ICD-10-CM | POA: Diagnosis not present

## 2015-04-29 DIAGNOSIS — Z23 Encounter for immunization: Secondary | ICD-10-CM | POA: Diagnosis not present

## 2015-10-26 DIAGNOSIS — E119 Type 2 diabetes mellitus without complications: Secondary | ICD-10-CM | POA: Diagnosis not present

## 2015-10-26 DIAGNOSIS — E782 Mixed hyperlipidemia: Secondary | ICD-10-CM | POA: Diagnosis not present

## 2015-10-28 DIAGNOSIS — E782 Mixed hyperlipidemia: Secondary | ICD-10-CM | POA: Diagnosis not present

## 2015-10-28 DIAGNOSIS — N401 Enlarged prostate with lower urinary tract symptoms: Secondary | ICD-10-CM | POA: Diagnosis not present

## 2015-10-28 DIAGNOSIS — I10 Essential (primary) hypertension: Secondary | ICD-10-CM | POA: Diagnosis not present

## 2015-10-28 DIAGNOSIS — R7301 Impaired fasting glucose: Secondary | ICD-10-CM | POA: Diagnosis not present

## 2016-02-29 DIAGNOSIS — Z125 Encounter for screening for malignant neoplasm of prostate: Secondary | ICD-10-CM | POA: Diagnosis not present

## 2016-02-29 DIAGNOSIS — I10 Essential (primary) hypertension: Secondary | ICD-10-CM | POA: Diagnosis not present

## 2016-02-29 DIAGNOSIS — R7301 Impaired fasting glucose: Secondary | ICD-10-CM | POA: Diagnosis not present

## 2016-02-29 DIAGNOSIS — E782 Mixed hyperlipidemia: Secondary | ICD-10-CM | POA: Diagnosis not present

## 2016-03-03 ENCOUNTER — Other Ambulatory Visit: Payer: Self-pay

## 2016-03-09 DIAGNOSIS — M25569 Pain in unspecified knee: Secondary | ICD-10-CM | POA: Diagnosis not present

## 2016-03-09 DIAGNOSIS — R7301 Impaired fasting glucose: Secondary | ICD-10-CM | POA: Diagnosis not present

## 2016-03-09 DIAGNOSIS — K219 Gastro-esophageal reflux disease without esophagitis: Secondary | ICD-10-CM | POA: Diagnosis not present

## 2016-03-09 DIAGNOSIS — E782 Mixed hyperlipidemia: Secondary | ICD-10-CM | POA: Diagnosis not present

## 2016-03-09 DIAGNOSIS — F4320 Adjustment disorder, unspecified: Secondary | ICD-10-CM | POA: Diagnosis not present

## 2016-03-09 DIAGNOSIS — N401 Enlarged prostate with lower urinary tract symptoms: Secondary | ICD-10-CM | POA: Diagnosis not present

## 2016-03-09 DIAGNOSIS — I10 Essential (primary) hypertension: Secondary | ICD-10-CM | POA: Diagnosis not present

## 2016-03-24 DIAGNOSIS — H5203 Hypermetropia, bilateral: Secondary | ICD-10-CM | POA: Diagnosis not present

## 2016-03-24 DIAGNOSIS — H52223 Regular astigmatism, bilateral: Secondary | ICD-10-CM | POA: Diagnosis not present

## 2016-03-24 DIAGNOSIS — H00029 Hordeolum internum unspecified eye, unspecified eyelid: Secondary | ICD-10-CM | POA: Diagnosis not present

## 2016-03-24 DIAGNOSIS — H2513 Age-related nuclear cataract, bilateral: Secondary | ICD-10-CM | POA: Diagnosis not present

## 2016-04-11 DIAGNOSIS — Z23 Encounter for immunization: Secondary | ICD-10-CM | POA: Diagnosis not present

## 2016-04-24 ENCOUNTER — Ambulatory Visit (INDEPENDENT_AMBULATORY_CARE_PROVIDER_SITE_OTHER): Payer: Medicare Other | Admitting: Otolaryngology

## 2016-04-24 DIAGNOSIS — H903 Sensorineural hearing loss, bilateral: Secondary | ICD-10-CM | POA: Diagnosis not present

## 2016-05-22 DIAGNOSIS — Z23 Encounter for immunization: Secondary | ICD-10-CM | POA: Diagnosis not present

## 2016-08-18 DIAGNOSIS — I482 Chronic atrial fibrillation: Secondary | ICD-10-CM | POA: Diagnosis not present

## 2016-08-18 DIAGNOSIS — E785 Hyperlipidemia, unspecified: Secondary | ICD-10-CM | POA: Diagnosis not present

## 2016-08-18 DIAGNOSIS — E119 Type 2 diabetes mellitus without complications: Secondary | ICD-10-CM | POA: Diagnosis not present

## 2016-08-18 DIAGNOSIS — N529 Male erectile dysfunction, unspecified: Secondary | ICD-10-CM | POA: Diagnosis not present

## 2016-08-18 DIAGNOSIS — R7301 Impaired fasting glucose: Secondary | ICD-10-CM | POA: Diagnosis not present

## 2016-08-18 DIAGNOSIS — I10 Essential (primary) hypertension: Secondary | ICD-10-CM | POA: Diagnosis not present

## 2016-08-18 DIAGNOSIS — E039 Hypothyroidism, unspecified: Secondary | ICD-10-CM | POA: Diagnosis not present

## 2016-08-18 DIAGNOSIS — E782 Mixed hyperlipidemia: Secondary | ICD-10-CM | POA: Diagnosis not present

## 2016-08-22 DIAGNOSIS — M545 Low back pain: Secondary | ICD-10-CM | POA: Diagnosis not present

## 2016-08-22 DIAGNOSIS — I10 Essential (primary) hypertension: Secondary | ICD-10-CM | POA: Diagnosis not present

## 2016-08-22 DIAGNOSIS — N401 Enlarged prostate with lower urinary tract symptoms: Secondary | ICD-10-CM | POA: Diagnosis not present

## 2016-08-22 DIAGNOSIS — Z0000 Encounter for general adult medical examination without abnormal findings: Secondary | ICD-10-CM | POA: Diagnosis not present

## 2016-08-22 DIAGNOSIS — M25569 Pain in unspecified knee: Secondary | ICD-10-CM | POA: Diagnosis not present

## 2016-08-22 DIAGNOSIS — R7301 Impaired fasting glucose: Secondary | ICD-10-CM | POA: Diagnosis not present

## 2016-08-22 DIAGNOSIS — F4322 Adjustment disorder with anxiety: Secondary | ICD-10-CM | POA: Diagnosis not present

## 2016-08-22 DIAGNOSIS — E782 Mixed hyperlipidemia: Secondary | ICD-10-CM | POA: Diagnosis not present

## 2016-08-22 DIAGNOSIS — J00 Acute nasopharyngitis [common cold]: Secondary | ICD-10-CM | POA: Diagnosis not present

## 2017-01-24 ENCOUNTER — Emergency Department (HOSPITAL_COMMUNITY): Payer: Medicare Other

## 2017-01-24 ENCOUNTER — Emergency Department (HOSPITAL_COMMUNITY)
Admission: EM | Admit: 2017-01-24 | Discharge: 2017-01-24 | Disposition: A | Discharge status: Home / Self Care | Payer: Medicare Other | Attending: Emergency Medicine | Admitting: Emergency Medicine

## 2017-01-24 ENCOUNTER — Encounter (HOSPITAL_COMMUNITY): Payer: Self-pay | Admitting: Cardiology

## 2017-01-24 DIAGNOSIS — R0600 Dyspnea, unspecified: Secondary | ICD-10-CM | POA: Diagnosis not present

## 2017-01-24 DIAGNOSIS — Z7902 Long term (current) use of antithrombotics/antiplatelets: Secondary | ICD-10-CM | POA: Diagnosis not present

## 2017-01-24 DIAGNOSIS — R42 Dizziness and giddiness: Secondary | ICD-10-CM | POA: Insufficient documentation

## 2017-01-24 DIAGNOSIS — S0990XA Unspecified injury of head, initial encounter: Secondary | ICD-10-CM | POA: Diagnosis not present

## 2017-01-24 DIAGNOSIS — Z79899 Other long term (current) drug therapy: Secondary | ICD-10-CM | POA: Insufficient documentation

## 2017-01-24 DIAGNOSIS — R609 Edema, unspecified: Secondary | ICD-10-CM

## 2017-01-24 DIAGNOSIS — I10 Essential (primary) hypertension: Secondary | ICD-10-CM | POA: Diagnosis not present

## 2017-01-24 DIAGNOSIS — S299XXA Unspecified injury of thorax, initial encounter: Secondary | ICD-10-CM | POA: Diagnosis not present

## 2017-01-24 LAB — CBC WITH DIFFERENTIAL/PLATELET
BASOS PCT: 0 %
Basophils Absolute: 0 10*3/uL (ref 0.0–0.1)
Eosinophils Absolute: 0 10*3/uL (ref 0.0–0.7)
Eosinophils Relative: 0 %
HEMATOCRIT: 42.5 % (ref 39.0–52.0)
Hemoglobin: 14.8 g/dL (ref 13.0–17.0)
LYMPHS PCT: 29 %
Lymphs Abs: 2 10*3/uL (ref 0.7–4.0)
MCH: 31.3 pg (ref 26.0–34.0)
MCHC: 34.8 g/dL (ref 30.0–36.0)
MCV: 89.9 fL (ref 78.0–100.0)
MONO ABS: 0.6 10*3/uL (ref 0.1–1.0)
MONOS PCT: 8 %
Neutro Abs: 4.5 10*3/uL (ref 1.7–7.7)
Neutrophils Relative %: 63 %
PLATELETS: 205 10*3/uL (ref 150–400)
RBC: 4.73 MIL/uL (ref 4.22–5.81)
RDW: 12.7 % (ref 11.5–15.5)
WBC: 7.1 10*3/uL (ref 4.0–10.5)

## 2017-01-24 LAB — URINALYSIS, ROUTINE W REFLEX MICROSCOPIC
BILIRUBIN URINE: NEGATIVE
Bacteria, UA: NONE SEEN
GLUCOSE, UA: NEGATIVE mg/dL
KETONES UR: NEGATIVE mg/dL
LEUKOCYTES UA: NEGATIVE
Nitrite: NEGATIVE
Protein, ur: NEGATIVE mg/dL
SPECIFIC GRAVITY, URINE: 1.013 (ref 1.005–1.030)
pH: 6 (ref 5.0–8.0)

## 2017-01-24 LAB — COMPREHENSIVE METABOLIC PANEL
ALT: 18 U/L (ref 17–63)
AST: 18 U/L (ref 15–41)
Albumin: 4.1 g/dL (ref 3.5–5.0)
Alkaline Phosphatase: 52 U/L (ref 38–126)
Anion gap: 8 (ref 5–15)
BILIRUBIN TOTAL: 0.9 mg/dL (ref 0.3–1.2)
BUN: 15 mg/dL (ref 6–20)
CO2: 26 mmol/L (ref 22–32)
Calcium: 9 mg/dL (ref 8.9–10.3)
Chloride: 106 mmol/L (ref 101–111)
Creatinine, Ser: 1.03 mg/dL (ref 0.61–1.24)
GFR calc Af Amer: >60 mL/min (ref >60)
Glucose, Bld: 112 mg/dL — ABNORMAL HIGH (ref 65–99)
POTASSIUM: 4.2 mmol/L (ref 3.5–5.1)
Sodium: 140 mmol/L (ref 135–145)
TOTAL PROTEIN: 6.8 g/dL (ref 6.5–8.1)

## 2017-01-24 LAB — TROPONIN I

## 2017-01-24 LAB — BRAIN NATRIURETIC PEPTIDE: B NATRIURETIC PEPTIDE 5: 57 pg/mL (ref 0.0–100.0)

## 2017-01-24 MED ORDER — FUROSEMIDE 10 MG/ML IJ SOLN: 40.0000 mg | Freq: Once | INTRAMUSCULAR | Status: DC

## 2017-01-24 MED ORDER — HYDROCHLOROTHIAZIDE 25 MG PO TABS: 25.0000 mg | ORAL_TABLET | Freq: Every day | ORAL | 0 refills | Status: DC

## 2017-01-24 NOTE — ED Notes (Signed)
Patient transported to CT 

## 2017-01-24 NOTE — ED Provider Notes (Signed)
[facility] DEPT Provider Note   CSN: [id number] Arrival date & time: 01/24/17  1754     History   Chief Complaint Chief Complaint  Patient presents with  . Dizziness    HPI Adam Banks is a 82 y.o. male.  HPI Patient states that Saturday evening he fell from standing and struck his head. He had no loss of consciousness. Denies any headache. Monday he developed dizziness which he describes as lightheadedness. This is worse with position change. Not associated with nausea. He has episodic blurred vision but denies any currently. Has had persistent bilateral lower extremity swelling. Also admits to episodic indigestion in his chest for which he takes Pepcid. Currently denies any chest pain. Patient states he is not on any blood pressure medication. No previous cardiac history. No previous evaluation by cardiology. Patient has had increased dyspnea with exertion over the last few weeks. Denies orthopnea. Currently is asymptomatic. Past Medical History:  Diagnosis Date  . Enlarged prostate 2014  . History of urinary hesitancy   . Hypertension     There are no active problems to display for this patient.   Past Surgical History:  Procedure Laterality Date  . HYDROCELE EXCISION Left 05/05/2014   Procedure: LEFT HYDROCELECTOMY ADULT;  Surgeon: Jorja Loa, MD;  Location: [facility];  Service: Urology;  Laterality: Left;  . INGUINAL HERNIA REPAIR Right 1977  . TONSILLECTOMY         Home Medications    Prior to Admission medications   Medication Sig Start Date End Date Taking? Authorizing Provider  Aspirin-Caffeine (ANACIN) 400-32 MG TABS Take 1 tablet by mouth as needed (for pain).   Yes [provider]  finasteride (PROSCAR) 5 MG tablet Take 5 mg by mouth daily.   Yes [provider]  pravastatin (PRAVACHOL) 40 MG tablet Take 40 mg by mouth every morning.    Yes [provider]  hydrochlorothiazide (HYDRODIURIL) 25 MG tablet Take 1  tablet (25 mg total) by mouth daily. 01/24/17   Julianne Rice, MD    Family History History reviewed. No pertinent family history.  Social History Social History  Substance Use Topics  . Smoking status: Never Smoker  . Smokeless tobacco: Not on file  . Alcohol use No     Allergies   Flomax [tamsulosin hcl]   Review of Systems Review of Systems  Constitutional: Negative for chills, fatigue and fever.  HENT: Negative for facial swelling and sore throat.   Eyes: Positive for visual disturbance. Negative for photophobia and pain.  Respiratory: Positive for shortness of breath. Negative for cough, chest tightness and wheezing.   Cardiovascular: Positive for chest pain and leg swelling. Negative for palpitations.  Gastrointestinal: Negative for abdominal pain, constipation, diarrhea, nausea and vomiting.  Genitourinary: Positive for frequency. Negative for difficulty urinating, dysuria, flank pain and hematuria.  Musculoskeletal: Negative for arthralgias, back pain, joint swelling, myalgias and neck pain.  Skin: Negative for rash and wound.  Neurological: Positive for dizziness and light-headedness. Negative for syncope, speech difficulty, weakness and headaches.  All other systems reviewed and are negative.    Physical Exam Updated Vital Signs BP (!) 193/73   Pulse (!) 55   Temp 98.1 F (36.7 C) (Oral)   Resp 18   Ht 6' (1.829 m)   Wt 117.9 kg (260 lb)   SpO2 97%   BMI 35.26 kg/m   Physical Exam  Constitutional: He is oriented to person, place, and time. He appears well-developed and well-nourished. No distress.  HENT:  Head: Normocephalic and atraumatic.  Mouth/Throat: Oropharynx is clear and moist. No oropharyngeal exudate.  Eyes: Pupils are equal, round, and reactive to light. EOM are normal.  Few beats of horizontal nystagmus  Neck: Normal range of motion. Neck supple.  No posterior midline cervical tenderness to palpation.  Cardiovascular: Normal rate and  regular rhythm.   Pulmonary/Chest: Effort normal and breath sounds normal. No respiratory distress. He has no wheezes. He has no rales. He exhibits no tenderness.  Abdominal: Soft. Bowel sounds are normal. There is no tenderness. There is no rebound and no guarding.  Musculoskeletal: Normal range of motion. He exhibits edema. He exhibits no tenderness.  3+ bilateral lower extremity pitting edema. Distal pulses intact.  Neurological: He is alert and oriented to person, place, and time.  Patient is alert and oriented x3 with clear, goal oriented speech. Patient has 5/5 motor in all extremities. Sensation is intact to light touch. Bilateral finger-to-nose is normal with no signs of dysmetria.  Skin: Skin is warm and dry. Capillary refill takes less than 2 seconds. No rash noted. No erythema.  Psychiatric: He has a normal mood and affect. His behavior is normal.  Nursing note and vitals reviewed.    ED Treatments / Results  Labs (all labs ordered are listed, but only abnormal results are displayed) Labs Reviewed  COMPREHENSIVE METABOLIC PANEL - Abnormal; Notable for the following:       Result Value   Glucose, Bld 112 (*)    All other components within normal limits  URINALYSIS, ROUTINE W REFLEX MICROSCOPIC - Abnormal; Notable for the following:    Hgb urine dipstick SMALL (*)    Squamous Epithelial / LPF 0-5 (*)    All other components within normal limits  CBC WITH DIFFERENTIAL/PLATELET  BRAIN NATRIURETIC PEPTIDE  TROPONIN I    EKG  EKG Interpretation  Date/Time:  Wednesday January 24 2017 21:43:10 EDT Ventricular Rate:  57 PR Interval:    QRS Duration: 95 QT Interval:  462 QTC Calculation: 450 R Axis:   -2 Text Interpretation:  Sinus or ectopic atrial rhythm Low voltage, precordial leads Nonspecific T abnormalities, lateral leads Baseline wander in lead(s) V4 No significant change since last tracing Confirmed by Gareth Morgan [phone]) on 01/25/2017 10:37:02 PM        Radiology Dg Chest 2 View  Result Date: 01/24/2017 CLINICAL DATA:  Dizziness since Monday.  Fell Sunday. EXAM: CHEST  2 VIEW COMPARISON:  04/01/2012 FINDINGS: The heart size and mediastinal contours are within normal limits. Both lungs are clear. 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 Associated bibasilar atelectasis. 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 IMPRESSION: 1. No active cardiopulmonary disease. 2. 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 3. Bibasilar atelectasis. Electronically Signed   By: Ashley Royalty M.D.   On: 01/24/2017 22:25   Ct Head Wo Contrast  Result Date: 01/24/2017 CLINICAL DATA:  81 y/o  M; fall and dizziness. EXAM: CT HEAD WITHOUT CONTRAST TECHNIQUE: Contiguous axial images were obtained from the base of the skull through the vertex without intravenous contrast. COMPARISON:  None. FINDINGS: Brain: No evidence of acute infarction, hemorrhage, hydrocephalus, extra-axial collection or mass lesion/mass effect. Vascular: Mild calcific atherosclerosis of the carotid bifurcation. No hyperdense vessel identified. Skull: Normal. Negative for fracture or focal lesion. Sinuses/Orbits: No acute finding. Other: None. IMPRESSION: No acute intracranial abnormality identified. Unremarkable CT of the head for age. Electronically Signed   By: Edgardo Roys.D.  On: 01/24/2017 22:46    Procedures Procedures (including critical care time)  Medications Ordered in ED Medications - No data to display   Initial Impression / Assessment and Plan / ED Course  I have reviewed the triage vital signs and the nursing notes.  Pertinent labs & imaging results that were available during my care of the patient were reviewed by me and considered in my medical decision making (see chart for details).    Patient states he is feeling better. CT head without acute abnormality. Suspect dizziness related to elevated blood  pressure. We'll start patient on hydrochlorothiazide and advised close follow-up with a primary physician. Return precautions given.   Final Clinical Impressions(s) / ED Diagnoses   Final diagnoses:  Dizziness  Hypertension, unspecified type  Peripheral edema    New Prescriptions Discharge Medication List as of 01/24/2017 11:18 PM    START taking these medications   Details  hydrochlorothiazide (HYDRODIURIL) 25 MG tablet Take 1 tablet (25 mg total) by mouth daily., Starting Wed 01/24/2017, Print         Julianne Rice, MD 01/26/17 2143

## 2017-01-24 NOTE — ED Notes (Signed)
While doing orthostatic vitals on this pt, pt became dizzy when I sat him up from the lying position, while standing and when he sat back down on the bed. Pt states, "I feel woozy."

## 2017-01-24 NOTE — ED Triage Notes (Signed)
Dizziness since Monday.  B/p 171/86 at [place].  200/100 pressure at home.

## 2017-01-29 DIAGNOSIS — Z6837 Body mass index (BMI) 37.0-37.9, adult: Secondary | ICD-10-CM | POA: Diagnosis not present

## 2017-01-29 DIAGNOSIS — I10 Essential (primary) hypertension: Secondary | ICD-10-CM | POA: Diagnosis not present

## 2017-01-29 DIAGNOSIS — H8149 Vertigo of central origin, unspecified ear: Secondary | ICD-10-CM | POA: Diagnosis not present

## 2017-02-15 DIAGNOSIS — I10 Essential (primary) hypertension: Secondary | ICD-10-CM | POA: Diagnosis not present

## 2017-02-15 DIAGNOSIS — R7301 Impaired fasting glucose: Secondary | ICD-10-CM | POA: Diagnosis not present

## 2017-02-19 DIAGNOSIS — R7301 Impaired fasting glucose: Secondary | ICD-10-CM | POA: Diagnosis not present

## 2017-02-19 DIAGNOSIS — Z6837 Body mass index (BMI) 37.0-37.9, adult: Secondary | ICD-10-CM | POA: Diagnosis not present

## 2017-02-19 DIAGNOSIS — N401 Enlarged prostate with lower urinary tract symptoms: Secondary | ICD-10-CM | POA: Diagnosis not present

## 2017-02-19 DIAGNOSIS — H8149 Vertigo of central origin, unspecified ear: Secondary | ICD-10-CM | POA: Diagnosis not present

## 2017-02-19 DIAGNOSIS — E782 Mixed hyperlipidemia: Secondary | ICD-10-CM | POA: Diagnosis not present

## 2017-02-19 DIAGNOSIS — I10 Essential (primary) hypertension: Secondary | ICD-10-CM | POA: Diagnosis not present

## 2017-02-19 DIAGNOSIS — R600 Localized edema: Secondary | ICD-10-CM | POA: Diagnosis not present

## 2017-03-21 DIAGNOSIS — Z23 Encounter for immunization: Secondary | ICD-10-CM | POA: Diagnosis not present

## 2017-03-21 DIAGNOSIS — R600 Localized edema: Secondary | ICD-10-CM | POA: Diagnosis not present

## 2017-03-21 DIAGNOSIS — H8190 Unspecified disorder of vestibular function, unspecified ear: Secondary | ICD-10-CM | POA: Diagnosis not present

## 2017-03-21 DIAGNOSIS — I10 Essential (primary) hypertension: Secondary | ICD-10-CM | POA: Diagnosis not present

## 2017-03-21 DIAGNOSIS — E782 Mixed hyperlipidemia: Secondary | ICD-10-CM | POA: Diagnosis not present

## 2017-03-21 DIAGNOSIS — Z6837 Body mass index (BMI) 37.0-37.9, adult: Secondary | ICD-10-CM | POA: Diagnosis not present

## 2017-03-21 DIAGNOSIS — R7301 Impaired fasting glucose: Secondary | ICD-10-CM | POA: Diagnosis not present

## 2017-06-14 DIAGNOSIS — F4322 Adjustment disorder with anxiety: Secondary | ICD-10-CM | POA: Diagnosis not present

## 2017-06-14 DIAGNOSIS — N401 Enlarged prostate with lower urinary tract symptoms: Secondary | ICD-10-CM | POA: Diagnosis not present

## 2017-06-14 DIAGNOSIS — R7301 Impaired fasting glucose: Secondary | ICD-10-CM | POA: Diagnosis not present

## 2017-06-14 DIAGNOSIS — I10 Essential (primary) hypertension: Secondary | ICD-10-CM | POA: Diagnosis not present

## 2017-06-14 DIAGNOSIS — F4320 Adjustment disorder, unspecified: Secondary | ICD-10-CM | POA: Diagnosis not present

## 2017-06-14 DIAGNOSIS — K219 Gastro-esophageal reflux disease without esophagitis: Secondary | ICD-10-CM | POA: Diagnosis not present

## 2017-06-14 DIAGNOSIS — M545 Low back pain: Secondary | ICD-10-CM | POA: Diagnosis not present

## 2017-06-14 DIAGNOSIS — I861 Scrotal varices: Secondary | ICD-10-CM | POA: Diagnosis not present

## 2017-06-26 DIAGNOSIS — N401 Enlarged prostate with lower urinary tract symptoms: Secondary | ICD-10-CM | POA: Diagnosis not present

## 2017-06-26 DIAGNOSIS — R7301 Impaired fasting glucose: Secondary | ICD-10-CM | POA: Diagnosis not present

## 2017-06-26 DIAGNOSIS — I10 Essential (primary) hypertension: Secondary | ICD-10-CM | POA: Diagnosis not present

## 2017-06-26 DIAGNOSIS — E782 Mixed hyperlipidemia: Secondary | ICD-10-CM | POA: Diagnosis not present

## 2017-09-21 DIAGNOSIS — N401 Enlarged prostate with lower urinary tract symptoms: Secondary | ICD-10-CM | POA: Diagnosis not present

## 2017-09-21 DIAGNOSIS — F4320 Adjustment disorder, unspecified: Secondary | ICD-10-CM | POA: Diagnosis not present

## 2017-09-21 DIAGNOSIS — M545 Low back pain: Secondary | ICD-10-CM | POA: Diagnosis not present

## 2017-09-21 DIAGNOSIS — K219 Gastro-esophageal reflux disease without esophagitis: Secondary | ICD-10-CM | POA: Diagnosis not present

## 2017-09-21 DIAGNOSIS — I10 Essential (primary) hypertension: Secondary | ICD-10-CM | POA: Diagnosis not present

## 2017-09-21 DIAGNOSIS — E782 Mixed hyperlipidemia: Secondary | ICD-10-CM | POA: Diagnosis not present

## 2017-09-21 DIAGNOSIS — R7301 Impaired fasting glucose: Secondary | ICD-10-CM | POA: Diagnosis not present

## 2017-09-21 DIAGNOSIS — F4322 Adjustment disorder with anxiety: Secondary | ICD-10-CM | POA: Diagnosis not present

## 2017-09-21 DIAGNOSIS — I861 Scrotal varices: Secondary | ICD-10-CM | POA: Diagnosis not present

## 2017-10-15 DIAGNOSIS — I861 Scrotal varices: Secondary | ICD-10-CM | POA: Diagnosis not present

## 2017-10-15 DIAGNOSIS — F4322 Adjustment disorder with anxiety: Secondary | ICD-10-CM | POA: Diagnosis not present

## 2017-10-15 DIAGNOSIS — F4320 Adjustment disorder, unspecified: Secondary | ICD-10-CM | POA: Diagnosis not present

## 2017-10-15 DIAGNOSIS — N401 Enlarged prostate with lower urinary tract symptoms: Secondary | ICD-10-CM | POA: Diagnosis not present

## 2017-10-15 DIAGNOSIS — K219 Gastro-esophageal reflux disease without esophagitis: Secondary | ICD-10-CM | POA: Diagnosis not present

## 2017-10-15 DIAGNOSIS — E782 Mixed hyperlipidemia: Secondary | ICD-10-CM | POA: Diagnosis not present

## 2017-10-15 DIAGNOSIS — I10 Essential (primary) hypertension: Secondary | ICD-10-CM | POA: Diagnosis not present

## 2017-10-15 DIAGNOSIS — R7301 Impaired fasting glucose: Secondary | ICD-10-CM | POA: Diagnosis not present

## 2017-10-15 DIAGNOSIS — M545 Low back pain: Secondary | ICD-10-CM | POA: Diagnosis not present

## 2017-10-19 DIAGNOSIS — E782 Mixed hyperlipidemia: Secondary | ICD-10-CM | POA: Diagnosis not present

## 2017-10-19 DIAGNOSIS — I10 Essential (primary) hypertension: Secondary | ICD-10-CM | POA: Diagnosis not present

## 2017-10-19 DIAGNOSIS — R7301 Impaired fasting glucose: Secondary | ICD-10-CM | POA: Diagnosis not present

## 2017-10-19 DIAGNOSIS — Z0000 Encounter for general adult medical examination without abnormal findings: Secondary | ICD-10-CM | POA: Diagnosis not present

## 2017-10-19 DIAGNOSIS — N400 Enlarged prostate without lower urinary tract symptoms: Secondary | ICD-10-CM | POA: Diagnosis not present

## 2018-02-08 ENCOUNTER — Other Ambulatory Visit: Payer: Self-pay

## 2018-02-19 DIAGNOSIS — I10 Essential (primary) hypertension: Secondary | ICD-10-CM | POA: Diagnosis not present

## 2018-02-19 DIAGNOSIS — M545 Low back pain: Secondary | ICD-10-CM | POA: Diagnosis not present

## 2018-02-19 DIAGNOSIS — N400 Enlarged prostate without lower urinary tract symptoms: Secondary | ICD-10-CM | POA: Diagnosis not present

## 2018-02-19 DIAGNOSIS — I861 Scrotal varices: Secondary | ICD-10-CM | POA: Diagnosis not present

## 2018-02-19 DIAGNOSIS — K219 Gastro-esophageal reflux disease without esophagitis: Secondary | ICD-10-CM | POA: Diagnosis not present

## 2018-02-19 DIAGNOSIS — F4320 Adjustment disorder, unspecified: Secondary | ICD-10-CM | POA: Diagnosis not present

## 2018-02-19 DIAGNOSIS — Z0000 Encounter for general adult medical examination without abnormal findings: Secondary | ICD-10-CM | POA: Diagnosis not present

## 2018-02-19 DIAGNOSIS — E782 Mixed hyperlipidemia: Secondary | ICD-10-CM | POA: Diagnosis not present

## 2018-02-19 DIAGNOSIS — N401 Enlarged prostate with lower urinary tract symptoms: Secondary | ICD-10-CM | POA: Diagnosis not present

## 2018-02-19 DIAGNOSIS — F4322 Adjustment disorder with anxiety: Secondary | ICD-10-CM | POA: Diagnosis not present

## 2018-02-19 DIAGNOSIS — R7301 Impaired fasting glucose: Secondary | ICD-10-CM | POA: Diagnosis not present

## 2018-02-22 DIAGNOSIS — E782 Mixed hyperlipidemia: Secondary | ICD-10-CM | POA: Diagnosis not present

## 2018-02-22 DIAGNOSIS — I10 Essential (primary) hypertension: Secondary | ICD-10-CM | POA: Diagnosis not present

## 2018-02-22 DIAGNOSIS — R7301 Impaired fasting glucose: Secondary | ICD-10-CM | POA: Diagnosis not present

## 2018-02-22 DIAGNOSIS — N400 Enlarged prostate without lower urinary tract symptoms: Secondary | ICD-10-CM | POA: Diagnosis not present

## 2018-04-12 DIAGNOSIS — Z23 Encounter for immunization: Secondary | ICD-10-CM | POA: Diagnosis not present

## 2018-05-24 DIAGNOSIS — B3742 Candidal balanitis: Secondary | ICD-10-CM | POA: Diagnosis not present

## 2018-05-24 DIAGNOSIS — F5221 Male erectile disorder: Secondary | ICD-10-CM | POA: Diagnosis not present

## 2018-06-05 DIAGNOSIS — J209 Acute bronchitis, unspecified: Secondary | ICD-10-CM | POA: Diagnosis not present

## 2018-06-05 DIAGNOSIS — J060 Acute laryngopharyngitis: Secondary | ICD-10-CM | POA: Diagnosis not present

## 2018-08-14 DIAGNOSIS — I10 Essential (primary) hypertension: Secondary | ICD-10-CM | POA: Diagnosis not present

## 2018-08-14 DIAGNOSIS — R7301 Impaired fasting glucose: Secondary | ICD-10-CM | POA: Diagnosis not present

## 2018-08-14 DIAGNOSIS — E782 Mixed hyperlipidemia: Secondary | ICD-10-CM | POA: Diagnosis not present

## 2018-08-20 DIAGNOSIS — N400 Enlarged prostate without lower urinary tract symptoms: Secondary | ICD-10-CM | POA: Diagnosis not present

## 2018-08-20 DIAGNOSIS — E782 Mixed hyperlipidemia: Secondary | ICD-10-CM | POA: Diagnosis not present

## 2018-08-20 DIAGNOSIS — R600 Localized edema: Secondary | ICD-10-CM | POA: Diagnosis not present

## 2018-08-20 DIAGNOSIS — I10 Essential (primary) hypertension: Secondary | ICD-10-CM | POA: Diagnosis not present

## 2018-08-20 DIAGNOSIS — N529 Male erectile dysfunction, unspecified: Secondary | ICD-10-CM | POA: Diagnosis not present

## 2018-08-20 DIAGNOSIS — R7301 Impaired fasting glucose: Secondary | ICD-10-CM | POA: Diagnosis not present

## 2018-09-09 DIAGNOSIS — D225 Melanocytic nevi of trunk: Secondary | ICD-10-CM | POA: Diagnosis not present

## 2018-09-09 DIAGNOSIS — C44329 Squamous cell carcinoma of skin of other parts of face: Secondary | ICD-10-CM | POA: Diagnosis not present

## 2018-11-12 DIAGNOSIS — Z08 Encounter for follow-up examination after completed treatment for malignant neoplasm: Secondary | ICD-10-CM | POA: Diagnosis not present

## 2018-11-12 DIAGNOSIS — Z85828 Personal history of other malignant neoplasm of skin: Secondary | ICD-10-CM | POA: Diagnosis not present

## 2018-11-12 DIAGNOSIS — L821 Other seborrheic keratosis: Secondary | ICD-10-CM | POA: Diagnosis not present

## 2018-11-26 DIAGNOSIS — Z0000 Encounter for general adult medical examination without abnormal findings: Secondary | ICD-10-CM | POA: Diagnosis not present

## 2018-12-04 DIAGNOSIS — L821 Other seborrheic keratosis: Secondary | ICD-10-CM | POA: Diagnosis not present

## 2018-12-04 DIAGNOSIS — B078 Other viral warts: Secondary | ICD-10-CM | POA: Diagnosis not present

## 2019-01-08 DIAGNOSIS — Z08 Encounter for follow-up examination after completed treatment for malignant neoplasm: Secondary | ICD-10-CM | POA: Diagnosis not present

## 2019-01-08 DIAGNOSIS — Z85828 Personal history of other malignant neoplasm of skin: Secondary | ICD-10-CM | POA: Diagnosis not present

## 2019-01-08 DIAGNOSIS — L570 Actinic keratosis: Secondary | ICD-10-CM | POA: Diagnosis not present

## 2019-01-08 DIAGNOSIS — X32XXXD Exposure to sunlight, subsequent encounter: Secondary | ICD-10-CM | POA: Diagnosis not present

## 2019-02-11 ENCOUNTER — Ambulatory Visit (INDEPENDENT_AMBULATORY_CARE_PROVIDER_SITE_OTHER): Payer: Medicare Other | Admitting: Urology

## 2019-02-11 DIAGNOSIS — N401 Enlarged prostate with lower urinary tract symptoms: Secondary | ICD-10-CM | POA: Diagnosis not present

## 2019-02-11 DIAGNOSIS — N5201 Erectile dysfunction due to arterial insufficiency: Secondary | ICD-10-CM

## 2019-02-18 DIAGNOSIS — N401 Enlarged prostate with lower urinary tract symptoms: Secondary | ICD-10-CM | POA: Diagnosis not present

## 2019-02-18 DIAGNOSIS — E782 Mixed hyperlipidemia: Secondary | ICD-10-CM | POA: Diagnosis not present

## 2019-02-18 DIAGNOSIS — I10 Essential (primary) hypertension: Secondary | ICD-10-CM | POA: Diagnosis not present

## 2019-02-18 DIAGNOSIS — R7301 Impaired fasting glucose: Secondary | ICD-10-CM | POA: Diagnosis not present

## 2019-02-24 IMAGING — DX DG CHEST 2V
2 series · 2 of 2 positions shown · non-contrast
Comparison: 04/01/2012

CLINICAL DATA: Dizziness since [REDACTED].  Fell [REDACTED].

EXAM:
CHEST  2 VIEW

[chest pa]
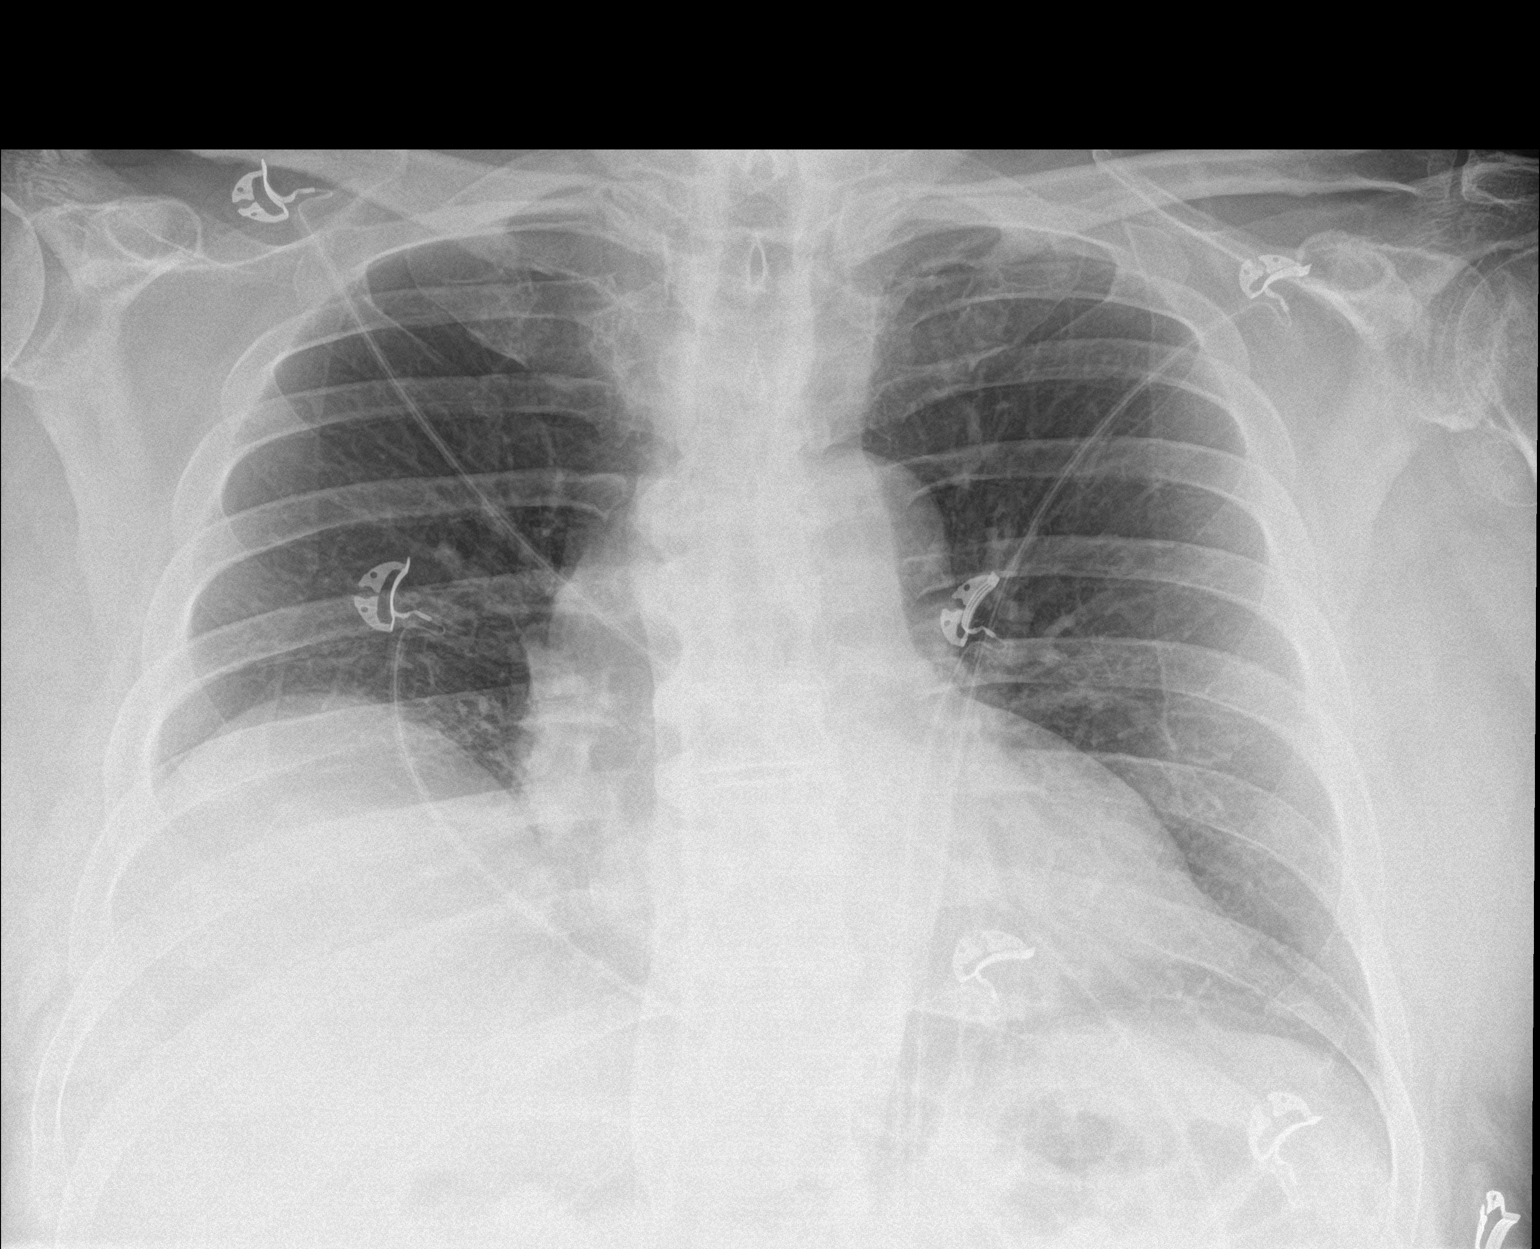

[chest lat]
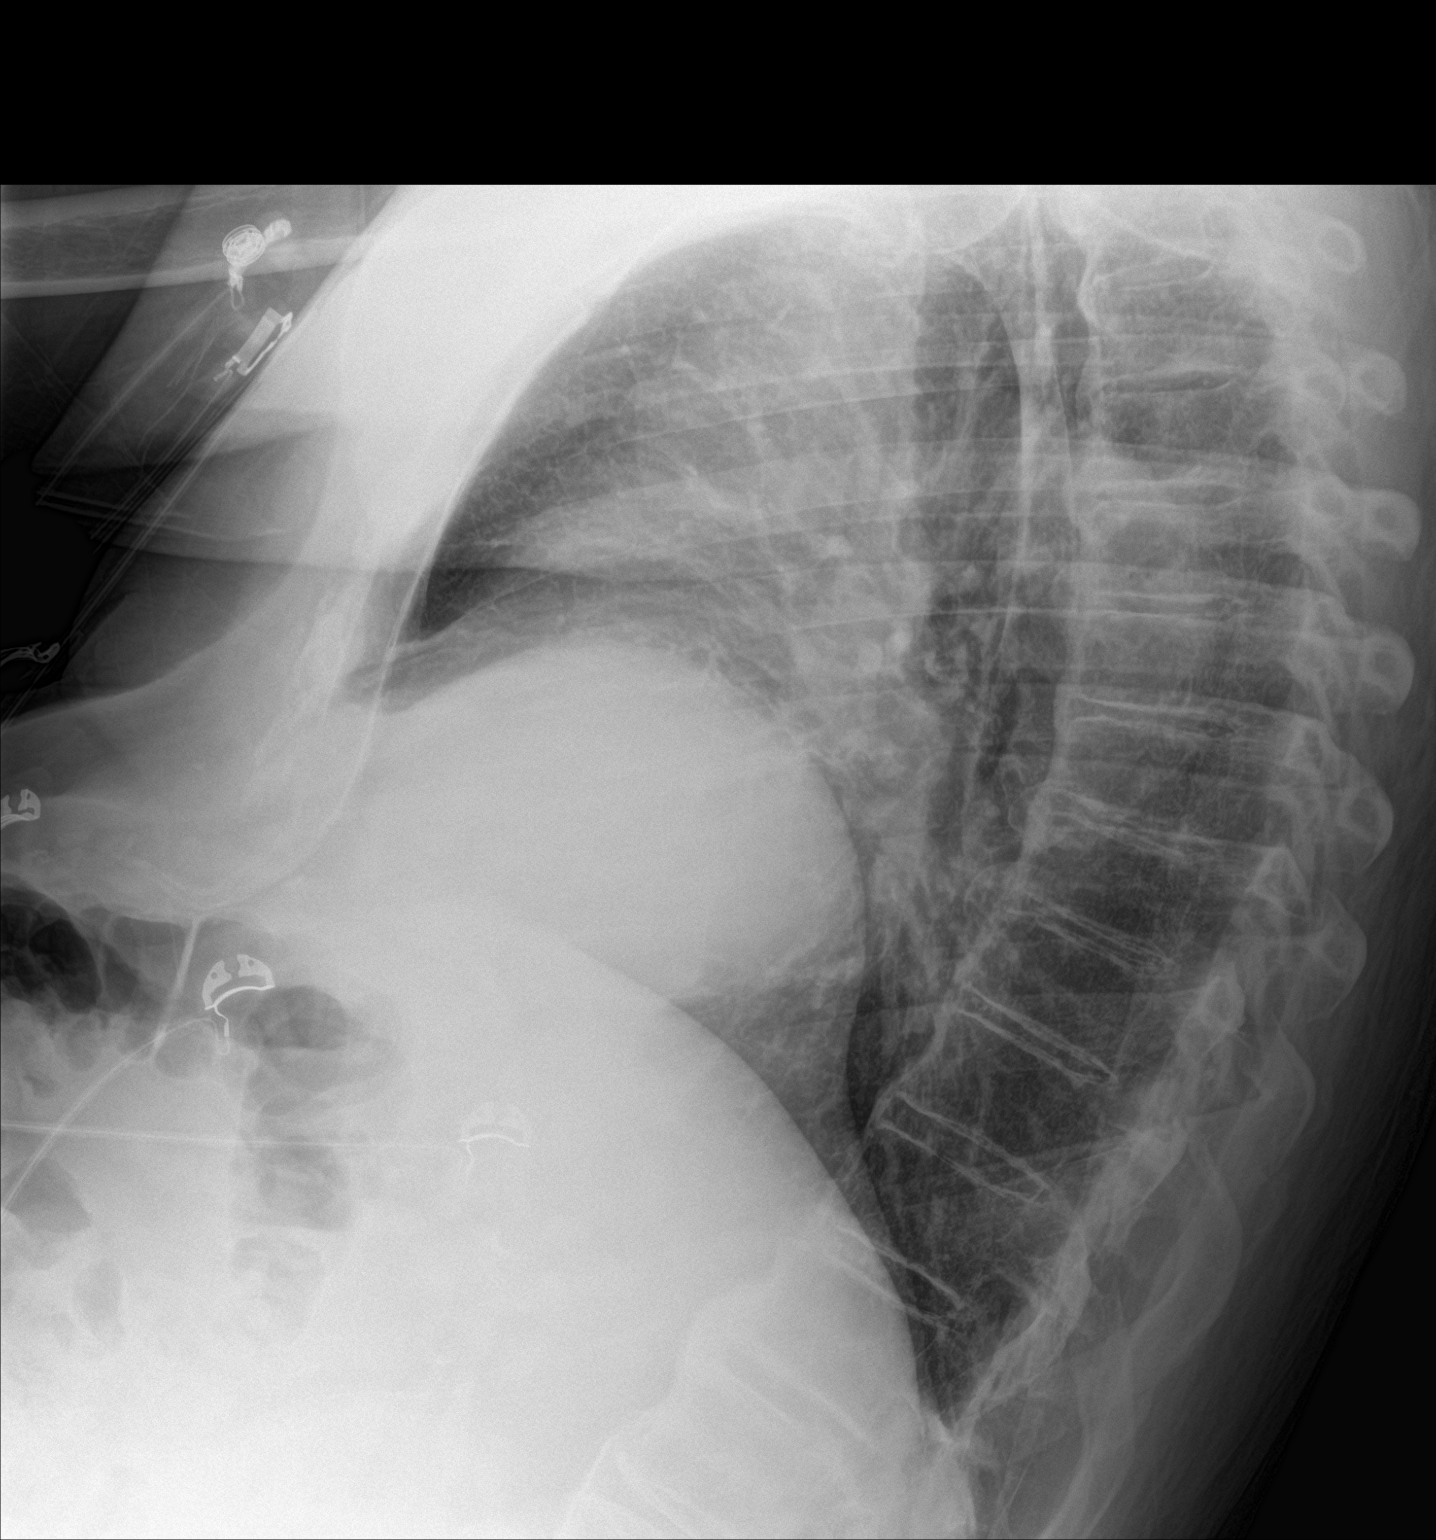

[2 of 2 positions shown; findings below may reference images not displayed]

FINDINGS: The heart size and mediastinal contours are within normal limits.
Both lungs are clear. Chronic eventration or elevation of right
hemidiaphragm is unchanged. Associated bibasilar atelectasis.
Osteoarthritis of the both AC and left glenohumeral joints.
IMPRESSION: 1. No active cardiopulmonary disease.
2. Chronic elevation or eventration of right hemidiaphragm.
3. Bibasilar atelectasis.

## 2019-02-25 DIAGNOSIS — R7301 Impaired fasting glucose: Secondary | ICD-10-CM | POA: Diagnosis not present

## 2019-02-25 DIAGNOSIS — E782 Mixed hyperlipidemia: Secondary | ICD-10-CM | POA: Diagnosis not present

## 2019-02-25 DIAGNOSIS — Z6836 Body mass index (BMI) 36.0-36.9, adult: Secondary | ICD-10-CM | POA: Diagnosis not present

## 2019-02-25 DIAGNOSIS — I129 Hypertensive chronic kidney disease with stage 1 through stage 4 chronic kidney disease, or unspecified chronic kidney disease: Secondary | ICD-10-CM | POA: Diagnosis not present

## 2019-02-25 DIAGNOSIS — N529 Male erectile dysfunction, unspecified: Secondary | ICD-10-CM | POA: Diagnosis not present

## 2019-02-25 DIAGNOSIS — N400 Enlarged prostate without lower urinary tract symptoms: Secondary | ICD-10-CM | POA: Diagnosis not present

## 2019-02-25 DIAGNOSIS — R600 Localized edema: Secondary | ICD-10-CM | POA: Diagnosis not present

## 2019-02-25 DIAGNOSIS — N182 Chronic kidney disease, stage 2 (mild): Secondary | ICD-10-CM | POA: Diagnosis not present

## 2019-02-25 DIAGNOSIS — E669 Obesity, unspecified: Secondary | ICD-10-CM | POA: Diagnosis not present

## 2019-02-25 DIAGNOSIS — R7303 Prediabetes: Secondary | ICD-10-CM | POA: Diagnosis not present

## 2019-03-06 ENCOUNTER — Other Ambulatory Visit: Payer: Self-pay

## 2019-03-06 ENCOUNTER — Ambulatory Visit (INDEPENDENT_AMBULATORY_CARE_PROVIDER_SITE_OTHER): Payer: Medicare Other | Admitting: Internal Medicine

## 2019-03-06 ENCOUNTER — Encounter: Payer: Self-pay | Admitting: Internal Medicine

## 2019-03-06 ENCOUNTER — Encounter: Payer: Self-pay | Admitting: *Deleted

## 2019-03-06 VITALS — BP 128/76 | HR 68 | Temp 97.8°F | Ht 72.0 in | Wt 271.0 lb

## 2019-03-06 DIAGNOSIS — R079 Chest pain, unspecified: Secondary | ICD-10-CM

## 2019-03-06 DIAGNOSIS — R0789 Other chest pain: Secondary | ICD-10-CM

## 2019-03-06 DIAGNOSIS — R55 Syncope and collapse: Secondary | ICD-10-CM | POA: Diagnosis not present

## 2019-03-06 NOTE — Progress Notes (Signed)
Cardiology Office Note   Date:  03/06/2019   ID:  Adam Banks, DOB [birth year], MRN [id number]  PCP:  Celene Squibb, MD  Cardiologist:   Dorris Carnes, MD   Pt referred by Dr Alyson Ingles for chst discomfort      History of Present Illness: Adam Banks is a 84 y.o. male with a history of syncope   I saw him back in 2013   The spell sounds like it may have been due to dehydration with orthostasis   PT on flomax at the time    Some bradycardia noted at [facility] (where he went)   EKG showed SB in office here   I was not convincied significant   That was only cardiology visit  The pt is currently being seen in  Urology clinic for ED He was seen recently and gave a history of chest discomfort   Not very actvie   Referred to cardiology  The pt says he is not that active due to heat  Doesn't walk much   When walks to mail box and back (about 200 feet total) he is very short of breathg   He denies CP   No PND  Several years ago he was splitting wood   Felt heart racing /poyunding in chest   Does not cut wood any more   Has not had recurrence  Only time he is dizzine is when he stands up from bending   No syncope       Current Meds  Medication Sig  . finasteride (PROSCAR) 5 MG tablet Take 5 mg by mouth daily.  Marland Kitchen losartan (COZAAR) 100 MG tablet Take 100 mg by mouth daily.  . pravastatin (PRAVACHOL) 40 MG tablet Take 40 mg by mouth every morning.   . [DISCONTINUED] Aspirin-Caffeine (ANACIN) 400-32 MG TABS Take 1 tablet by mouth as needed (for pain).  . [DISCONTINUED] hydrochlorothiazide (HYDRODIURIL) 25 MG tablet Take 1 tablet (25 mg total) by mouth daily.     Allergies:   Flomax [tamsulosin hcl]   Past Medical History:  Diagnosis Date  . Enlarged prostate 2014  . History of urinary hesitancy   . Hypertension     Past Surgical History:  Procedure Laterality Date  . HYDROCELE EXCISION Left 05/05/2014   Procedure: LEFT HYDROCELECTOMY ADULT;  Surgeon: Jorja Loa,  MD;  Location: [facility];  Service: Urology;  Laterality: Left;  . INGUINAL HERNIA REPAIR Right 1977  . TONSILLECTOMY       Social History:  The patient  reports that he has never smoked. He has never used smokeless tobacco. He reports that he does not drink alcohol or use drugs.   Family History:  [name] family history includes Heart attack in his father; Hodgkin's lymphoma in his mother.    ROS:  Please see the history of present illness. All other systems are reviewed and  Negative to the above problem except as noted.    PHYSICAL EXAM: VS:  BP 128/76 (BP Location: Left Arm)   Pulse 68   Temp 97.8 F (36.6 C)   Ht 6' (1.829 m)   Wt 271 lb (122.9 kg)   SpO2 97%   BMI 36.75 kg/m   GEN: Morbidly obese 84 yo in no acute distress  HEENT: normal  Neck: no JVD, carotid bruits, or masses Cardiac: RRR; Gr II/VI systolic murmur base   No  rubs, or gallops  Triv edema  Respiratory:  clear to auscultation bilaterally, normal  work of breathing GI: soft, nontender, nondistended, + BS  No hepatomegaly  MS: no deformity Moving all extremities   Skin: warm and dry, no rash Neuro:  Strength and sensation are intact Psych: euthymic mood, full affect   EKG:  EKG is ordered today.  SR 63 bpm      Lipid Panel No results found for: CHOL, TRIG, HDL, CHOLHDL, VLDL, LDLCALC, LDLDIRECT    Wt Readings from Last 3 Encounters:  03/06/19 271 lb (122.9 kg)  01/24/17 260 lb (117.9 kg)  04/30/14 264 lb (119.7 kg)      ASSESSMENT AND PLAN:  1  Dyspnea.   The pt hs no known CAD   He does have atherosclerosis on head CT a couple years ago    His spells of severe dyspnea are concerning   He chalks it up to heat       On exam he has a murmur at the base  WOuld recomm echo to eval LV diastolic and systolic function as well as AV    If no signif abnormalities then would sched CT coronary angiogram  2  Chest discomfort   He really only had 1 spell where her felt his heart pounding   He deneis  pain     3  HL   Will get lipids   Needs to stay on statin with CV dz  4   Dizziness   Only with bending to standing     5  Morbid obesity   Would do well to lose some wt  6  ED   Followed in urology      Current medicines are reviewed at length with the patient today.  The patient does not have concerns regarding medicines.  Signed, Dorris Carnes, MD  03/06/2019 3:19 PM    [address], [place], [place]  [address] Phone: [phone]; Fax: [phone]

## 2019-03-06 NOTE — Patient Instructions (Signed)
Medication Instructions:  Your physician recommends that you continue on your current medications as directed. Please refer to the Current Medication list given to you today.  If you need a refill on your cardiac medications before your next appointment, please call your pharmacy.   Lab work: NONE   If you have labs (blood work) drawn today and your tests are completely normal, you will receive your results only by: Marland Kitchen MyChart Message (if you have MyChart) OR . A paper copy in the mail If you have any lab test that is abnormal or we need to change your treatment, we will call you to review the results.  Testing/Procedures: Your physician has requested that you have an echocardiogram. Echocardiography is a painless test that uses sound waves to create images of your heart. It provides your doctor with information about the size and shape of your heart and how well your heart's chambers and valves are working. This procedure takes approximately one hour. There are no restrictions for this procedure.    Follow-Up: At [facility], you and your health needs are our priority.  As part of our continuing mission to provide you with exceptional heart care, we have created designated Provider Care Teams.  These Care Teams include your primary Cardiologist (physician) and Advanced Practice Providers (APPs -  Physician Assistants and Nurse Practitioners) who all work together to provide you with the care you need, when you need it. You will need a follow up appointment pending test results. Please call our office 2 months in advance to schedule this appointment.  You may see No primary care provider on file.  or one of the following Advanced Practice Providers on your designated Care Team:   [place], PA-C [facility]) . Ermalinda Barrios, PA-C ([place] [place])  Any Other Special Instructions Will Be Listed Below (If Applicable). Thank you for choosing [facility]!

## 2019-03-10 ENCOUNTER — Other Ambulatory Visit: Payer: Self-pay

## 2019-03-10 ENCOUNTER — Telehealth: Payer: Self-pay

## 2019-03-10 DIAGNOSIS — E782 Mixed hyperlipidemia: Secondary | ICD-10-CM

## 2019-03-10 DIAGNOSIS — R55 Syncope and collapse: Secondary | ICD-10-CM

## 2019-03-10 MED ORDER — ROSUVASTATIN CALCIUM 20 MG PO TABS: 20.0000 mg | ORAL_TABLET | Freq: Every day | ORAL | 3 refills | Status: DC

## 2019-03-10 NOTE — Telephone Encounter (Signed)
-----   Message from Fay Records, MD sent at 03/07/2019  2:37 PM EDT ----- Patient's LDL is 113   Needs to be lower given that he has plaquing on carotid artery He is on pravastatin   I would stop and start Crestor 20 mg   Follow up lipid panel and AST in 8 wks

## 2019-03-10 NOTE — Telephone Encounter (Signed)
pt agrees to stop pravachol and  start crestor, e-scribed to [facility] lab slip

## 2019-03-12 ENCOUNTER — Other Ambulatory Visit: Payer: Self-pay

## 2019-03-12 ENCOUNTER — Ambulatory Visit (HOSPITAL_COMMUNITY)
Admission: RE | Admit: 2019-03-12 | Discharge: 2019-03-12 | Disposition: A | Discharge status: Home / Self Care | Payer: Medicare Other | Source: Ambulatory Visit | Attending: Internal Medicine | Admitting: Internal Medicine

## 2019-03-12 DIAGNOSIS — R0789 Other chest pain: Secondary | ICD-10-CM | POA: Diagnosis not present

## 2019-03-12 DIAGNOSIS — R079 Chest pain, unspecified: Secondary | ICD-10-CM

## 2019-03-12 NOTE — Progress Notes (Signed)
*  PRELIMINARY RESULTS* Echocardiogram 2D Echocardiogram has been performed.  Samuel Germany 03/12/2019, 1:43 PM

## 2019-03-18 ENCOUNTER — Ambulatory Visit (INDEPENDENT_AMBULATORY_CARE_PROVIDER_SITE_OTHER): Payer: Medicare Other | Admitting: Urology

## 2019-03-18 DIAGNOSIS — N401 Enlarged prostate with lower urinary tract symptoms: Secondary | ICD-10-CM

## 2019-03-18 DIAGNOSIS — N5201 Erectile dysfunction due to arterial insufficiency: Secondary | ICD-10-CM | POA: Diagnosis not present

## 2019-04-08 ENCOUNTER — Ambulatory Visit: Payer: Medicare Other | Admitting: Urology

## 2019-05-05 ENCOUNTER — Other Ambulatory Visit (HOSPITAL_COMMUNITY)
Admission: RE | Admit: 2019-05-05 | Discharge: 2019-05-05 | Disposition: A | Discharge status: Home / Self Care | Payer: Medicare Other | Source: Ambulatory Visit | Attending: Internal Medicine | Admitting: Internal Medicine

## 2019-05-05 DIAGNOSIS — E782 Mixed hyperlipidemia: Secondary | ICD-10-CM | POA: Diagnosis not present

## 2019-05-05 LAB — LIPID PANEL
Cholesterol: 132 mg/dL (ref 0–200)
HDL: 36 mg/dL — ABNORMAL LOW (ref >40)
LDL Cholesterol: 77 mg/dL (ref 0–99)
Total CHOL/HDL Ratio: 3.7 RATIO
Triglycerides: 96 mg/dL (ref <150)
VLDL: 19 mg/dL (ref 0–40)

## 2019-05-05 LAB — AST: AST: 20 U/L (ref 15–41)

## 2019-05-06 ENCOUNTER — Ambulatory Visit (INDEPENDENT_AMBULATORY_CARE_PROVIDER_SITE_OTHER): Payer: Medicare Other | Admitting: Urology

## 2019-05-06 DIAGNOSIS — N5201 Erectile dysfunction due to arterial insufficiency: Secondary | ICD-10-CM | POA: Diagnosis not present

## 2019-05-06 DIAGNOSIS — N401 Enlarged prostate with lower urinary tract symptoms: Secondary | ICD-10-CM

## 2019-05-06 DIAGNOSIS — R3913 Splitting of urinary stream: Secondary | ICD-10-CM

## 2019-05-12 ENCOUNTER — Telehealth: Payer: Self-pay | Admitting: Nurse Practitioner

## 2019-05-12 DIAGNOSIS — E782 Mixed hyperlipidemia: Secondary | ICD-10-CM

## 2019-05-12 MED ORDER — ROSUVASTATIN CALCIUM 40 MG PO TABS: 40.0000 mg | ORAL_TABLET | Freq: Every day | ORAL | 3 refills | Status: DC

## 2019-05-12 NOTE — Telephone Encounter (Signed)
Reviewed results with patient's wife with patient beside her. His pravastatin was changed to rosuvastatin 20 mg in August. He reports that he takes it everyday. I advised him to increase Rosuvastatin to 40 mg daily and scheduled a lab appointment for 2/2. I advised I will call back with any further advice from Dr. Harrington Challenger and patient's wife thanked me for the call.

## 2019-05-12 NOTE — Telephone Encounter (Signed)
-----   Message from Fay Records, MD sent at 05/07/2019  4:25 PM EDT ----- LDL is 77   Is he taking 20 mg Pravastatin?   If so, then I would increase to 40 mg daily F/U lipids in 4 months

## 2019-05-13 DIAGNOSIS — L570 Actinic keratosis: Secondary | ICD-10-CM | POA: Diagnosis not present

## 2019-05-13 DIAGNOSIS — I781 Nevus, non-neoplastic: Secondary | ICD-10-CM | POA: Diagnosis not present

## 2019-05-13 DIAGNOSIS — X32XXXD Exposure to sunlight, subsequent encounter: Secondary | ICD-10-CM | POA: Diagnosis not present

## 2019-05-13 DIAGNOSIS — L821 Other seborrheic keratosis: Secondary | ICD-10-CM | POA: Diagnosis not present

## 2019-05-15 DIAGNOSIS — M545 Low back pain: Secondary | ICD-10-CM | POA: Diagnosis not present

## 2019-05-15 DIAGNOSIS — E782 Mixed hyperlipidemia: Secondary | ICD-10-CM | POA: Diagnosis not present

## 2019-05-15 DIAGNOSIS — F4322 Adjustment disorder with anxiety: Secondary | ICD-10-CM | POA: Diagnosis not present

## 2019-05-15 DIAGNOSIS — Z0000 Encounter for general adult medical examination without abnormal findings: Secondary | ICD-10-CM | POA: Diagnosis not present

## 2019-05-15 DIAGNOSIS — N400 Enlarged prostate without lower urinary tract symptoms: Secondary | ICD-10-CM | POA: Diagnosis not present

## 2019-05-15 DIAGNOSIS — K219 Gastro-esophageal reflux disease without esophagitis: Secondary | ICD-10-CM | POA: Diagnosis not present

## 2019-05-15 DIAGNOSIS — I10 Essential (primary) hypertension: Secondary | ICD-10-CM | POA: Diagnosis not present

## 2019-05-15 DIAGNOSIS — F4320 Adjustment disorder, unspecified: Secondary | ICD-10-CM | POA: Diagnosis not present

## 2019-05-15 DIAGNOSIS — N401 Enlarged prostate with lower urinary tract symptoms: Secondary | ICD-10-CM | POA: Diagnosis not present

## 2019-05-15 DIAGNOSIS — R7301 Impaired fasting glucose: Secondary | ICD-10-CM | POA: Diagnosis not present

## 2019-06-27 DIAGNOSIS — M545 Low back pain: Secondary | ICD-10-CM | POA: Diagnosis not present

## 2019-06-27 DIAGNOSIS — Z0000 Encounter for general adult medical examination without abnormal findings: Secondary | ICD-10-CM | POA: Diagnosis not present

## 2019-06-27 DIAGNOSIS — N401 Enlarged prostate with lower urinary tract symptoms: Secondary | ICD-10-CM | POA: Diagnosis not present

## 2019-06-27 DIAGNOSIS — N400 Enlarged prostate without lower urinary tract symptoms: Secondary | ICD-10-CM | POA: Diagnosis not present

## 2019-06-27 DIAGNOSIS — F4320 Adjustment disorder, unspecified: Secondary | ICD-10-CM | POA: Diagnosis not present

## 2019-06-27 DIAGNOSIS — R7301 Impaired fasting glucose: Secondary | ICD-10-CM | POA: Diagnosis not present

## 2019-06-27 DIAGNOSIS — K219 Gastro-esophageal reflux disease without esophagitis: Secondary | ICD-10-CM | POA: Diagnosis not present

## 2019-06-27 DIAGNOSIS — I129 Hypertensive chronic kidney disease with stage 1 through stage 4 chronic kidney disease, or unspecified chronic kidney disease: Secondary | ICD-10-CM | POA: Diagnosis not present

## 2019-06-27 DIAGNOSIS — I10 Essential (primary) hypertension: Secondary | ICD-10-CM | POA: Diagnosis not present

## 2019-06-27 DIAGNOSIS — E7849 Other hyperlipidemia: Secondary | ICD-10-CM | POA: Diagnosis not present

## 2019-06-27 DIAGNOSIS — N183 Chronic kidney disease, stage 3 unspecified: Secondary | ICD-10-CM | POA: Diagnosis not present

## 2019-06-27 DIAGNOSIS — F4322 Adjustment disorder with anxiety: Secondary | ICD-10-CM | POA: Diagnosis not present

## 2019-07-22 DIAGNOSIS — Z23 Encounter for immunization: Secondary | ICD-10-CM | POA: Diagnosis not present

## 2019-07-29 DIAGNOSIS — F4320 Adjustment disorder, unspecified: Secondary | ICD-10-CM | POA: Diagnosis not present

## 2019-07-29 DIAGNOSIS — N401 Enlarged prostate with lower urinary tract symptoms: Secondary | ICD-10-CM | POA: Diagnosis not present

## 2019-07-29 DIAGNOSIS — F4322 Adjustment disorder with anxiety: Secondary | ICD-10-CM | POA: Diagnosis not present

## 2019-07-29 DIAGNOSIS — N400 Enlarged prostate without lower urinary tract symptoms: Secondary | ICD-10-CM | POA: Diagnosis not present

## 2019-07-29 DIAGNOSIS — I10 Essential (primary) hypertension: Secondary | ICD-10-CM | POA: Diagnosis not present

## 2019-07-29 DIAGNOSIS — Z0000 Encounter for general adult medical examination without abnormal findings: Secondary | ICD-10-CM | POA: Diagnosis not present

## 2019-07-29 DIAGNOSIS — R7301 Impaired fasting glucose: Secondary | ICD-10-CM | POA: Diagnosis not present

## 2019-07-29 DIAGNOSIS — K219 Gastro-esophageal reflux disease without esophagitis: Secondary | ICD-10-CM | POA: Diagnosis not present

## 2019-07-29 DIAGNOSIS — M545 Low back pain: Secondary | ICD-10-CM | POA: Diagnosis not present

## 2019-07-29 DIAGNOSIS — E7849 Other hyperlipidemia: Secondary | ICD-10-CM | POA: Diagnosis not present

## 2019-08-12 ENCOUNTER — Other Ambulatory Visit: Payer: Medicare Other

## 2019-08-22 DIAGNOSIS — Z23 Encounter for immunization: Secondary | ICD-10-CM | POA: Diagnosis not present

## 2019-09-01 DIAGNOSIS — N401 Enlarged prostate with lower urinary tract symptoms: Secondary | ICD-10-CM | POA: Diagnosis not present

## 2019-09-01 DIAGNOSIS — N183 Chronic kidney disease, stage 3 unspecified: Secondary | ICD-10-CM | POA: Diagnosis not present

## 2019-09-01 DIAGNOSIS — K219 Gastro-esophageal reflux disease without esophagitis: Secondary | ICD-10-CM | POA: Diagnosis not present

## 2019-09-01 DIAGNOSIS — J209 Acute bronchitis, unspecified: Secondary | ICD-10-CM | POA: Diagnosis not present

## 2019-09-01 DIAGNOSIS — F4320 Adjustment disorder, unspecified: Secondary | ICD-10-CM | POA: Diagnosis not present

## 2019-09-01 DIAGNOSIS — I10 Essential (primary) hypertension: Secondary | ICD-10-CM | POA: Diagnosis not present

## 2019-09-01 DIAGNOSIS — I129 Hypertensive chronic kidney disease with stage 1 through stage 4 chronic kidney disease, or unspecified chronic kidney disease: Secondary | ICD-10-CM | POA: Diagnosis not present

## 2019-09-01 DIAGNOSIS — N182 Chronic kidney disease, stage 2 (mild): Secondary | ICD-10-CM | POA: Diagnosis not present

## 2019-09-01 DIAGNOSIS — M545 Low back pain: Secondary | ICD-10-CM | POA: Diagnosis not present

## 2019-09-01 DIAGNOSIS — E7849 Other hyperlipidemia: Secondary | ICD-10-CM | POA: Diagnosis not present

## 2019-09-01 DIAGNOSIS — F4322 Adjustment disorder with anxiety: Secondary | ICD-10-CM | POA: Diagnosis not present

## 2019-09-01 DIAGNOSIS — J069 Acute upper respiratory infection, unspecified: Secondary | ICD-10-CM | POA: Diagnosis not present

## 2019-09-02 DIAGNOSIS — Z6837 Body mass index (BMI) 37.0-37.9, adult: Secondary | ICD-10-CM | POA: Diagnosis not present

## 2019-09-02 DIAGNOSIS — N182 Chronic kidney disease, stage 2 (mild): Secondary | ICD-10-CM | POA: Diagnosis not present

## 2019-09-02 DIAGNOSIS — E782 Mixed hyperlipidemia: Secondary | ICD-10-CM | POA: Diagnosis not present

## 2019-09-02 DIAGNOSIS — E1122 Type 2 diabetes mellitus with diabetic chronic kidney disease: Secondary | ICD-10-CM | POA: Diagnosis not present

## 2019-09-02 DIAGNOSIS — I129 Hypertensive chronic kidney disease with stage 1 through stage 4 chronic kidney disease, or unspecified chronic kidney disease: Secondary | ICD-10-CM | POA: Diagnosis not present

## 2019-09-02 DIAGNOSIS — N400 Enlarged prostate without lower urinary tract symptoms: Secondary | ICD-10-CM | POA: Diagnosis not present

## 2019-09-02 DIAGNOSIS — N529 Male erectile dysfunction, unspecified: Secondary | ICD-10-CM | POA: Diagnosis not present

## 2019-09-02 DIAGNOSIS — R7303 Prediabetes: Secondary | ICD-10-CM | POA: Diagnosis not present

## 2019-10-28 DIAGNOSIS — L570 Actinic keratosis: Secondary | ICD-10-CM | POA: Diagnosis not present

## 2019-10-28 DIAGNOSIS — L821 Other seborrheic keratosis: Secondary | ICD-10-CM | POA: Diagnosis not present

## 2019-10-28 DIAGNOSIS — X32XXXD Exposure to sunlight, subsequent encounter: Secondary | ICD-10-CM | POA: Diagnosis not present

## 2019-11-20 DIAGNOSIS — M545 Low back pain: Secondary | ICD-10-CM | POA: Diagnosis not present

## 2019-11-20 DIAGNOSIS — R7301 Impaired fasting glucose: Secondary | ICD-10-CM | POA: Diagnosis not present

## 2019-11-20 DIAGNOSIS — R361 Hematospermia: Secondary | ICD-10-CM | POA: Diagnosis not present

## 2019-11-20 DIAGNOSIS — F4322 Adjustment disorder with anxiety: Secondary | ICD-10-CM | POA: Diagnosis not present

## 2019-11-20 DIAGNOSIS — I10 Essential (primary) hypertension: Secondary | ICD-10-CM | POA: Diagnosis not present

## 2019-11-20 DIAGNOSIS — Z0000 Encounter for general adult medical examination without abnormal findings: Secondary | ICD-10-CM | POA: Diagnosis not present

## 2019-11-20 DIAGNOSIS — E782 Mixed hyperlipidemia: Secondary | ICD-10-CM | POA: Diagnosis not present

## 2019-11-20 DIAGNOSIS — F4320 Adjustment disorder, unspecified: Secondary | ICD-10-CM | POA: Diagnosis not present

## 2019-11-20 DIAGNOSIS — K219 Gastro-esophageal reflux disease without esophagitis: Secondary | ICD-10-CM | POA: Diagnosis not present

## 2019-11-20 DIAGNOSIS — N401 Enlarged prostate with lower urinary tract symptoms: Secondary | ICD-10-CM | POA: Diagnosis not present

## 2019-11-20 DIAGNOSIS — N400 Enlarged prostate without lower urinary tract symptoms: Secondary | ICD-10-CM | POA: Diagnosis not present

## 2019-11-25 ENCOUNTER — Telehealth: Payer: Self-pay | Admitting: Internal Medicine

## 2019-11-25 NOTE — Telephone Encounter (Signed)
Returned call to [place] at [employer]. No answer. LMTCB .

## 2019-11-25 NOTE — Telephone Encounter (Signed)
Per phone call from [place] at Dr. Juel Burrow office-- called stating that the pt has been cutting his rosuvastatin (CRESTOR) 40 MG tablet [id number] in half due to brain fog  Please call Myriam Jacobson @ [phone] if there are any questions

## 2019-11-25 NOTE — Telephone Encounter (Signed)
Will forward to Dr. Ross.  

## 2020-01-13 ENCOUNTER — Ambulatory Visit (INDEPENDENT_AMBULATORY_CARE_PROVIDER_SITE_OTHER): Payer: Medicare Other | Admitting: Urology

## 2020-01-13 ENCOUNTER — Other Ambulatory Visit: Payer: Self-pay

## 2020-01-13 ENCOUNTER — Encounter: Payer: Self-pay | Admitting: Urology

## 2020-01-13 VITALS — BP 173/80 | HR 77 | Temp 97.7°F | Ht 72.0 in | Wt 265.0 lb

## 2020-01-13 DIAGNOSIS — N400 Enlarged prostate without lower urinary tract symptoms: Secondary | ICD-10-CM | POA: Diagnosis not present

## 2020-01-13 LAB — POCT URINALYSIS DIPSTICK
Bilirubin, UA: NEGATIVE
Glucose, UA: NEGATIVE
Ketones, UA: NEGATIVE
Leukocytes, UA: NEGATIVE
Nitrite, UA: NEGATIVE
Protein, UA: NEGATIVE
Spec Grav, UA: >=1.03 — AB (ref 1.010–1.025)
Urobilinogen, UA: NEGATIVE E.U./dL — AB
pH, UA: 5 (ref 5.0–8.0)

## 2020-01-13 NOTE — Progress Notes (Signed)
H&P   History of Present Illness: This man is here today for follow-up of BPH.  At his last visit 6 months ago he was given information on TURP and UroLift.   Past Medical History:  Diagnosis Date   Enlarged prostate 2014   History of urinary hesitancy    Hypertension     Past Surgical History:  Procedure Laterality Date   HYDROCELE EXCISION Left 05/05/2014   Procedure: LEFT HYDROCELECTOMY ADULT;  Surgeon: Jorja Loa, MD;  Location: [facility];  Service: Urology;  Laterality: Left;   INGUINAL HERNIA REPAIR Right 1977   TONSILLECTOMY      Home Medications:  Allergies as of 01/13/2020      Reactions   Flomax [tamsulosin Hcl] Other (See Comments)   Patient states that he passed out.      Medication List       Accurate as of January 13, 2020  9:38 AM. If you have any questions, ask your nurse or doctor.        alfuzosin 10 MG 24 hr tablet Commonly known as: UROXATRAL   losartan 100 MG tablet Commonly known as: COZAAR Take 100 mg by mouth daily.   Proscar 5 MG tablet Generic drug: finasteride Take 5 mg by mouth daily.   rosuvastatin 40 MG tablet Commonly known as: CRESTOR Take 1 tablet (40 mg total) by mouth daily.       Allergies:  Allergies  Allergen Reactions   Flomax [Tamsulosin Hcl] Other (See Comments)    Patient states that he passed out.    Family History  Problem Relation Age of Onset   Hodgkin's lymphoma Mother    Heart attack Father     Social History:  reports that he has never smoked. He has never used smokeless tobacco. He reports that he does not drink alcohol and does not use drugs.  ROS: Urological Symptom Review  Patient is experiencing the following symptoms: None Review of Systems Gastrointestinal (upper)  : Negative for upper GI symptoms Gastrointestinal (lower) : Negative for lower GI symptoms Constitutional : Negative for symptoms Skin: Negative for skin symptoms Eyes: Negative for eye  symptoms Ear/Nose/Throat : Negative for Ear/Nose/Throat symptoms Hematologic/Lymphatic: None Cardiovascular : Negative for cardiovascular symptoms Respiratory : Negative for respiratory symptoms Endocrine: Negative for endocrine symptoms Musculoskeletal: Negative for musculoskeletal symptoms Neurological: Negative for neurological symptoms Psychologic: Negative for psychiatric symptoms2 Physical Exam:  Vital signs in last 24 hours: BP (!) 173/80    Pulse 77    Temp 97.7 F (36.5 C)    Ht 6' (1.829 m)    Wt 265 lb (120.2 kg)    BMI 35.94 kg/m  Constitutional:  Alert and oriented, No acute distress Cardiovascular: Regular rate  Respiratory: Normal respiratory effort Abd--no hernias GU--phallus uncircumcised.  Testicles normal bilaterally.  No lesions.  Prostate 40 g, symmetrical, nonnodular, nontender. Neurologic: Grossly intact, no focal deficits Psychiatric: Normal mood and affect    Results for orders placed or performed in visit on 01/13/20 (from the past 24 hour(s))  POCT urinalysis dipstick     Status: Abnormal   Collection Time: 01/13/20  9:36 AM  Result Value Ref Range   Color, UA yellow    Clarity, UA clear    Glucose, UA Negative Negative   Bilirubin, UA neg    Ketones, UA neg    Spec Grav, UA >=1.030 (A) 1.010 - 1.025   Blood, UA trace    pH, UA 5.0 5.0 - 8.0   Protein, UA  Negative Negative   Urobilinogen, UA negative (A) 0.2 or 1.0 E.U./dL   Nitrite, UA neg    Leukocytes, UA Negative Negative   Appearance     Odor      I have reviewed prior pt notes  I have reviewed notes from referring/previous physicians  I have reviewed urinalysis results  I have reviewed prior PSA results   Impression/Assessment:  1.  Hematospermia, most likely benign.  Interesting that this is happening despite him being on finasteride.  Prostate exam normal.  2.  BPH, doing better with finasteride  Plan:  1.  Reassurance regarding hematospermia.  No treatment needed  at this point  2.  I will see back in about 3 months to review his course with him

## 2020-01-13 NOTE — Progress Notes (Signed)
See progress note.

## 2020-03-03 DIAGNOSIS — K219 Gastro-esophageal reflux disease without esophagitis: Secondary | ICD-10-CM | POA: Diagnosis not present

## 2020-03-03 DIAGNOSIS — J069 Acute upper respiratory infection, unspecified: Secondary | ICD-10-CM | POA: Diagnosis not present

## 2020-03-03 DIAGNOSIS — N183 Chronic kidney disease, stage 3 unspecified: Secondary | ICD-10-CM | POA: Diagnosis not present

## 2020-03-03 DIAGNOSIS — N182 Chronic kidney disease, stage 2 (mild): Secondary | ICD-10-CM | POA: Diagnosis not present

## 2020-03-03 DIAGNOSIS — F4322 Adjustment disorder with anxiety: Secondary | ICD-10-CM | POA: Diagnosis not present

## 2020-03-03 DIAGNOSIS — I129 Hypertensive chronic kidney disease with stage 1 through stage 4 chronic kidney disease, or unspecified chronic kidney disease: Secondary | ICD-10-CM | POA: Diagnosis not present

## 2020-03-03 DIAGNOSIS — F4320 Adjustment disorder, unspecified: Secondary | ICD-10-CM | POA: Diagnosis not present

## 2020-03-03 DIAGNOSIS — E7849 Other hyperlipidemia: Secondary | ICD-10-CM | POA: Diagnosis not present

## 2020-03-03 DIAGNOSIS — N401 Enlarged prostate with lower urinary tract symptoms: Secondary | ICD-10-CM | POA: Diagnosis not present

## 2020-03-03 DIAGNOSIS — J209 Acute bronchitis, unspecified: Secondary | ICD-10-CM | POA: Diagnosis not present

## 2020-03-03 DIAGNOSIS — M545 Low back pain: Secondary | ICD-10-CM | POA: Diagnosis not present

## 2020-03-08 DIAGNOSIS — Z0001 Encounter for general adult medical examination with abnormal findings: Secondary | ICD-10-CM | POA: Diagnosis not present

## 2020-03-08 DIAGNOSIS — R7303 Prediabetes: Secondary | ICD-10-CM | POA: Diagnosis not present

## 2020-03-08 DIAGNOSIS — N529 Male erectile dysfunction, unspecified: Secondary | ICD-10-CM | POA: Diagnosis not present

## 2020-03-08 DIAGNOSIS — I129 Hypertensive chronic kidney disease with stage 1 through stage 4 chronic kidney disease, or unspecified chronic kidney disease: Secondary | ICD-10-CM | POA: Diagnosis not present

## 2020-03-08 DIAGNOSIS — E782 Mixed hyperlipidemia: Secondary | ICD-10-CM | POA: Diagnosis not present

## 2020-03-08 DIAGNOSIS — N182 Chronic kidney disease, stage 2 (mild): Secondary | ICD-10-CM | POA: Diagnosis not present

## 2020-03-08 DIAGNOSIS — E1122 Type 2 diabetes mellitus with diabetic chronic kidney disease: Secondary | ICD-10-CM | POA: Diagnosis not present

## 2020-03-08 DIAGNOSIS — Z6837 Body mass index (BMI) 37.0-37.9, adult: Secondary | ICD-10-CM | POA: Diagnosis not present

## 2020-03-08 DIAGNOSIS — N400 Enlarged prostate without lower urinary tract symptoms: Secondary | ICD-10-CM | POA: Diagnosis not present

## 2020-03-23 DIAGNOSIS — H2513 Age-related nuclear cataract, bilateral: Secondary | ICD-10-CM | POA: Diagnosis not present

## 2020-04-14 DIAGNOSIS — L0202 Furuncle of face: Secondary | ICD-10-CM | POA: Diagnosis not present

## 2020-04-14 DIAGNOSIS — X32XXXD Exposure to sunlight, subsequent encounter: Secondary | ICD-10-CM | POA: Diagnosis not present

## 2020-04-14 DIAGNOSIS — L570 Actinic keratosis: Secondary | ICD-10-CM | POA: Diagnosis not present

## 2020-04-14 DIAGNOSIS — L820 Inflamed seborrheic keratosis: Secondary | ICD-10-CM | POA: Diagnosis not present

## 2020-04-14 DIAGNOSIS — B9689 Other specified bacterial agents as the cause of diseases classified elsewhere: Secondary | ICD-10-CM | POA: Diagnosis not present

## 2020-04-20 ENCOUNTER — Ambulatory Visit: Payer: Medicare Other | Admitting: Urology

## 2020-04-21 DIAGNOSIS — G720 Drug-induced myopathy: Secondary | ICD-10-CM | POA: Diagnosis not present

## 2020-04-21 DIAGNOSIS — R059 Cough, unspecified: Secondary | ICD-10-CM | POA: Diagnosis not present

## 2020-04-21 DIAGNOSIS — Z23 Encounter for immunization: Secondary | ICD-10-CM | POA: Diagnosis not present

## 2020-04-21 DIAGNOSIS — E782 Mixed hyperlipidemia: Secondary | ICD-10-CM | POA: Diagnosis not present

## 2020-05-04 ENCOUNTER — Other Ambulatory Visit: Payer: Self-pay

## 2020-05-04 ENCOUNTER — Encounter: Payer: Self-pay | Admitting: Urology

## 2020-05-04 ENCOUNTER — Ambulatory Visit (INDEPENDENT_AMBULATORY_CARE_PROVIDER_SITE_OTHER): Payer: Medicare Other | Admitting: Urology

## 2020-05-04 VITALS — BP 148/77 | HR 80 | Temp 98.4°F | Ht 72.0 in | Wt 265.0 lb

## 2020-05-04 DIAGNOSIS — N400 Enlarged prostate without lower urinary tract symptoms: Secondary | ICD-10-CM

## 2020-05-04 DIAGNOSIS — R351 Nocturia: Secondary | ICD-10-CM | POA: Diagnosis not present

## 2020-05-04 DIAGNOSIS — R350 Frequency of micturition: Secondary | ICD-10-CM

## 2020-05-04 LAB — URINALYSIS, ROUTINE W REFLEX MICROSCOPIC
Bilirubin, UA: NEGATIVE
Glucose, UA: NEGATIVE
Ketones, UA: NEGATIVE
Leukocytes,UA: NEGATIVE
Nitrite, UA: NEGATIVE
Protein,UA: NEGATIVE
Specific Gravity, UA: 1.02 (ref 1.005–1.030)
Urobilinogen, Ur: 0.2 mg/dL (ref 0.2–1.0)
pH, UA: 7 (ref 5.0–7.5)

## 2020-05-04 LAB — MICROSCOPIC EXAMINATION
Bacteria, UA: NONE SEEN
Epithelial Cells (non renal): NONE SEEN /hpf (ref 0–10)
Renal Epithel, UA: NONE SEEN /hpf
WBC, UA: NONE SEEN /hpf (ref 0–5)

## 2020-05-04 NOTE — Progress Notes (Signed)
° °  History of Present Illness: This man is here today for follow-up of BPH.  He is now on dual medical therapy with alfuzosin and finasteride.  He did not fill out an IPSS form, but he states he is voiding better, denies dysuria, gross hematuria.  He is currently satisfied with his urinary situation.  He has no significant side effects from these medications.  Past Medical History:  Diagnosis Date   Enlarged prostate 2014   History of urinary hesitancy    Hypertension     Past Surgical History:  Procedure Laterality Date   HYDROCELE EXCISION Left 05/05/2014   Procedure: LEFT HYDROCELECTOMY ADULT;  Surgeon: Jorja Loa, MD;  Location: [facility];  Service: Urology;  Laterality: Left;   INGUINAL HERNIA REPAIR Right 1977   TONSILLECTOMY      Home Medications:  Allergies as of 05/04/2020      Reactions   Flomax [tamsulosin Hcl] Other (See Comments)   Patient states that he passed out.      Medication List       Accurate as of May 04, 2020  8:18 AM. If you have any questions, ask your nurse or doctor.        alfuzosin 10 MG 24 hr tablet Commonly known as: UROXATRAL   losartan 100 MG tablet Commonly known as: COZAAR Take 100 mg by mouth daily.   Proscar 5 MG tablet Generic drug: finasteride Take 5 mg by mouth daily.   rosuvastatin 40 MG tablet Commonly known as: CRESTOR Take 1 tablet (40 mg total) by mouth daily.       Allergies:  Allergies  Allergen Reactions   Flomax [Tamsulosin Hcl] Other (See Comments)    Patient states that he passed out.    Family History  Problem Relation Age of Onset   Hodgkin's lymphoma Mother    Heart attack Father     Social History:  reports that he has never smoked. He has never used smokeless tobacco. He reports that he does not drink alcohol and does not use drugs.  ROS: A complete review of systems was performed.  All systems are negative except for pertinent findings as noted.  Physical Exam:  Vital  signs in last 24 hours: There were no vitals taken for this visit. Constitutional:  Alert and oriented, No acute distress Cardiovascular: Regular rate  Respiratory: Normal respiratory effort Neurologic: Grossly intact, no focal deficits Psychiatric: Normal mood and affect  I have reviewed prior pt notes  I have reviewed notes from referring/previous physicians  I have reviewed urinalysis results  I have independently reviewed prior imaging    Impression/Assessment:  BPH, symptoms improved with dual medical therapy, he does not request further management at this point  Plan:  I will see him back in 1 year for recheck, continue current alfuzosin and finasteride.

## 2020-05-04 NOTE — Progress Notes (Signed)

## 2020-06-16 ENCOUNTER — Telehealth: Payer: Self-pay

## 2020-06-16 ENCOUNTER — Other Ambulatory Visit: Payer: Self-pay

## 2020-06-16 DIAGNOSIS — R351 Nocturia: Secondary | ICD-10-CM

## 2020-06-16 DIAGNOSIS — N400 Enlarged prostate without lower urinary tract symptoms: Secondary | ICD-10-CM

## 2020-06-16 MED ORDER — ALFUZOSIN HCL ER 10 MG PO TB24: 10.0000 mg | ORAL_TABLET | Freq: Every day | ORAL | 3 refills | Status: DC

## 2020-06-16 NOTE — Telephone Encounter (Signed)
Pts wife called saying needed rx on Alfuzosin sent in to [facility] Rx sent. Wife notified.

## 2020-06-29 DIAGNOSIS — Z23 Encounter for immunization: Secondary | ICD-10-CM | POA: Diagnosis not present

## 2020-09-06 DIAGNOSIS — I10 Essential (primary) hypertension: Secondary | ICD-10-CM | POA: Diagnosis not present

## 2020-09-06 DIAGNOSIS — M791 Myalgia, unspecified site: Secondary | ICD-10-CM | POA: Diagnosis not present

## 2020-09-06 DIAGNOSIS — K219 Gastro-esophageal reflux disease without esophagitis: Secondary | ICD-10-CM | POA: Diagnosis not present

## 2020-09-06 DIAGNOSIS — E782 Mixed hyperlipidemia: Secondary | ICD-10-CM | POA: Diagnosis not present

## 2020-09-24 DIAGNOSIS — Z1331 Encounter for screening for depression: Secondary | ICD-10-CM | POA: Diagnosis not present

## 2020-09-24 DIAGNOSIS — J209 Acute bronchitis, unspecified: Secondary | ICD-10-CM | POA: Diagnosis not present

## 2020-09-24 DIAGNOSIS — Z6837 Body mass index (BMI) 37.0-37.9, adult: Secondary | ICD-10-CM | POA: Diagnosis not present

## 2020-09-24 DIAGNOSIS — N183 Chronic kidney disease, stage 3 unspecified: Secondary | ICD-10-CM | POA: Diagnosis not present

## 2020-09-24 DIAGNOSIS — J069 Acute upper respiratory infection, unspecified: Secondary | ICD-10-CM | POA: Diagnosis not present

## 2020-09-24 DIAGNOSIS — Z0001 Encounter for general adult medical examination with abnormal findings: Secondary | ICD-10-CM | POA: Diagnosis not present

## 2020-09-24 DIAGNOSIS — E7849 Other hyperlipidemia: Secondary | ICD-10-CM | POA: Diagnosis not present

## 2020-09-24 DIAGNOSIS — R059 Cough, unspecified: Secondary | ICD-10-CM | POA: Diagnosis not present

## 2020-09-27 DIAGNOSIS — M9902 Segmental and somatic dysfunction of thoracic region: Secondary | ICD-10-CM | POA: Diagnosis not present

## 2020-09-27 DIAGNOSIS — M5441 Lumbago with sciatica, right side: Secondary | ICD-10-CM | POA: Diagnosis not present

## 2020-09-27 DIAGNOSIS — M9905 Segmental and somatic dysfunction of pelvic region: Secondary | ICD-10-CM | POA: Diagnosis not present

## 2020-09-27 DIAGNOSIS — M9903 Segmental and somatic dysfunction of lumbar region: Secondary | ICD-10-CM | POA: Diagnosis not present

## 2020-09-28 DIAGNOSIS — E1122 Type 2 diabetes mellitus with diabetic chronic kidney disease: Secondary | ICD-10-CM | POA: Diagnosis not present

## 2020-09-28 DIAGNOSIS — I129 Hypertensive chronic kidney disease with stage 1 through stage 4 chronic kidney disease, or unspecified chronic kidney disease: Secondary | ICD-10-CM | POA: Diagnosis not present

## 2020-09-28 DIAGNOSIS — N529 Male erectile dysfunction, unspecified: Secondary | ICD-10-CM | POA: Diagnosis not present

## 2020-09-28 DIAGNOSIS — R7303 Prediabetes: Secondary | ICD-10-CM | POA: Diagnosis not present

## 2020-09-28 DIAGNOSIS — E782 Mixed hyperlipidemia: Secondary | ICD-10-CM | POA: Diagnosis not present

## 2020-09-28 DIAGNOSIS — Z6837 Body mass index (BMI) 37.0-37.9, adult: Secondary | ICD-10-CM | POA: Diagnosis not present

## 2020-09-28 DIAGNOSIS — N182 Chronic kidney disease, stage 2 (mild): Secondary | ICD-10-CM | POA: Diagnosis not present

## 2020-09-28 DIAGNOSIS — N400 Enlarged prostate without lower urinary tract symptoms: Secondary | ICD-10-CM | POA: Diagnosis not present

## 2020-10-01 DIAGNOSIS — M9903 Segmental and somatic dysfunction of lumbar region: Secondary | ICD-10-CM | POA: Diagnosis not present

## 2020-10-01 DIAGNOSIS — M9905 Segmental and somatic dysfunction of pelvic region: Secondary | ICD-10-CM | POA: Diagnosis not present

## 2020-10-01 DIAGNOSIS — M5441 Lumbago with sciatica, right side: Secondary | ICD-10-CM | POA: Diagnosis not present

## 2020-10-01 DIAGNOSIS — M9902 Segmental and somatic dysfunction of thoracic region: Secondary | ICD-10-CM | POA: Diagnosis not present

## 2020-10-04 DIAGNOSIS — M9905 Segmental and somatic dysfunction of pelvic region: Secondary | ICD-10-CM | POA: Diagnosis not present

## 2020-10-04 DIAGNOSIS — M9902 Segmental and somatic dysfunction of thoracic region: Secondary | ICD-10-CM | POA: Diagnosis not present

## 2020-10-04 DIAGNOSIS — M5441 Lumbago with sciatica, right side: Secondary | ICD-10-CM | POA: Diagnosis not present

## 2020-10-04 DIAGNOSIS — M9903 Segmental and somatic dysfunction of lumbar region: Secondary | ICD-10-CM | POA: Diagnosis not present

## 2020-10-06 DIAGNOSIS — K219 Gastro-esophageal reflux disease without esophagitis: Secondary | ICD-10-CM | POA: Diagnosis not present

## 2020-10-06 DIAGNOSIS — I10 Essential (primary) hypertension: Secondary | ICD-10-CM | POA: Diagnosis not present

## 2020-10-06 DIAGNOSIS — E782 Mixed hyperlipidemia: Secondary | ICD-10-CM | POA: Diagnosis not present

## 2020-10-06 DIAGNOSIS — M791 Myalgia, unspecified site: Secondary | ICD-10-CM | POA: Diagnosis not present

## 2020-10-08 DIAGNOSIS — M9902 Segmental and somatic dysfunction of thoracic region: Secondary | ICD-10-CM | POA: Diagnosis not present

## 2020-10-08 DIAGNOSIS — M9905 Segmental and somatic dysfunction of pelvic region: Secondary | ICD-10-CM | POA: Diagnosis not present

## 2020-10-08 DIAGNOSIS — M5441 Lumbago with sciatica, right side: Secondary | ICD-10-CM | POA: Diagnosis not present

## 2020-10-08 DIAGNOSIS — M9903 Segmental and somatic dysfunction of lumbar region: Secondary | ICD-10-CM | POA: Diagnosis not present

## 2020-10-11 DIAGNOSIS — M9903 Segmental and somatic dysfunction of lumbar region: Secondary | ICD-10-CM | POA: Diagnosis not present

## 2020-10-11 DIAGNOSIS — M5441 Lumbago with sciatica, right side: Secondary | ICD-10-CM | POA: Diagnosis not present

## 2020-10-11 DIAGNOSIS — M9902 Segmental and somatic dysfunction of thoracic region: Secondary | ICD-10-CM | POA: Diagnosis not present

## 2020-10-11 DIAGNOSIS — M9905 Segmental and somatic dysfunction of pelvic region: Secondary | ICD-10-CM | POA: Diagnosis not present

## 2020-10-15 DIAGNOSIS — M9903 Segmental and somatic dysfunction of lumbar region: Secondary | ICD-10-CM | POA: Diagnosis not present

## 2020-10-15 DIAGNOSIS — M9902 Segmental and somatic dysfunction of thoracic region: Secondary | ICD-10-CM | POA: Diagnosis not present

## 2020-10-15 DIAGNOSIS — M9905 Segmental and somatic dysfunction of pelvic region: Secondary | ICD-10-CM | POA: Diagnosis not present

## 2020-10-15 DIAGNOSIS — M5441 Lumbago with sciatica, right side: Secondary | ICD-10-CM | POA: Diagnosis not present

## 2020-10-18 DIAGNOSIS — M9905 Segmental and somatic dysfunction of pelvic region: Secondary | ICD-10-CM | POA: Diagnosis not present

## 2020-10-18 DIAGNOSIS — M9902 Segmental and somatic dysfunction of thoracic region: Secondary | ICD-10-CM | POA: Diagnosis not present

## 2020-10-18 DIAGNOSIS — M5441 Lumbago with sciatica, right side: Secondary | ICD-10-CM | POA: Diagnosis not present

## 2020-10-18 DIAGNOSIS — M9903 Segmental and somatic dysfunction of lumbar region: Secondary | ICD-10-CM | POA: Diagnosis not present

## 2020-10-21 DIAGNOSIS — M9905 Segmental and somatic dysfunction of pelvic region: Secondary | ICD-10-CM | POA: Diagnosis not present

## 2020-10-21 DIAGNOSIS — M9902 Segmental and somatic dysfunction of thoracic region: Secondary | ICD-10-CM | POA: Diagnosis not present

## 2020-10-21 DIAGNOSIS — M9903 Segmental and somatic dysfunction of lumbar region: Secondary | ICD-10-CM | POA: Diagnosis not present

## 2020-10-21 DIAGNOSIS — M5441 Lumbago with sciatica, right side: Secondary | ICD-10-CM | POA: Diagnosis not present

## 2020-10-27 DIAGNOSIS — R509 Fever, unspecified: Secondary | ICD-10-CM | POA: Diagnosis not present

## 2020-10-27 DIAGNOSIS — R051 Acute cough: Secondary | ICD-10-CM | POA: Diagnosis not present

## 2020-10-27 DIAGNOSIS — J0100 Acute maxillary sinusitis, unspecified: Secondary | ICD-10-CM | POA: Diagnosis not present

## 2020-10-27 DIAGNOSIS — R5381 Other malaise: Secondary | ICD-10-CM | POA: Diagnosis not present

## 2020-10-27 DIAGNOSIS — Z1152 Encounter for screening for COVID-19: Secondary | ICD-10-CM | POA: Diagnosis not present

## 2020-11-07 DIAGNOSIS — J159 Unspecified bacterial pneumonia: Secondary | ICD-10-CM | POA: Diagnosis not present

## 2020-11-07 DIAGNOSIS — R0600 Dyspnea, unspecified: Secondary | ICD-10-CM | POA: Diagnosis not present

## 2020-11-07 DIAGNOSIS — K219 Gastro-esophageal reflux disease without esophagitis: Secondary | ICD-10-CM | POA: Diagnosis not present

## 2020-11-07 DIAGNOSIS — R051 Acute cough: Secondary | ICD-10-CM | POA: Diagnosis not present

## 2020-11-07 DIAGNOSIS — I10 Essential (primary) hypertension: Secondary | ICD-10-CM | POA: Diagnosis not present

## 2020-11-09 ENCOUNTER — Other Ambulatory Visit: Payer: Self-pay

## 2020-11-09 ENCOUNTER — Other Ambulatory Visit (HOSPITAL_COMMUNITY): Payer: Self-pay | Admitting: Family Medicine

## 2020-11-09 ENCOUNTER — Ambulatory Visit (HOSPITAL_COMMUNITY)
Admission: RE | Admit: 2020-11-09 | Discharge: 2020-11-09 | Disposition: A | Discharge status: Home / Self Care | Payer: Medicare HMO | Source: Ambulatory Visit | Attending: Family Medicine | Admitting: Family Medicine

## 2020-11-09 DIAGNOSIS — E782 Mixed hyperlipidemia: Secondary | ICD-10-CM | POA: Diagnosis not present

## 2020-11-09 DIAGNOSIS — R0602 Shortness of breath: Secondary | ICD-10-CM | POA: Insufficient documentation

## 2020-11-09 DIAGNOSIS — E1122 Type 2 diabetes mellitus with diabetic chronic kidney disease: Secondary | ICD-10-CM | POA: Diagnosis not present

## 2020-11-09 DIAGNOSIS — N529 Male erectile dysfunction, unspecified: Secondary | ICD-10-CM | POA: Diagnosis not present

## 2020-11-09 DIAGNOSIS — N182 Chronic kidney disease, stage 2 (mild): Secondary | ICD-10-CM | POA: Diagnosis not present

## 2020-11-09 DIAGNOSIS — R7303 Prediabetes: Secondary | ICD-10-CM | POA: Diagnosis not present

## 2020-11-09 DIAGNOSIS — J9811 Atelectasis: Secondary | ICD-10-CM | POA: Diagnosis not present

## 2020-11-09 DIAGNOSIS — Z6837 Body mass index (BMI) 37.0-37.9, adult: Secondary | ICD-10-CM | POA: Diagnosis not present

## 2020-11-09 DIAGNOSIS — I517 Cardiomegaly: Secondary | ICD-10-CM | POA: Diagnosis not present

## 2020-11-09 DIAGNOSIS — N400 Enlarged prostate without lower urinary tract symptoms: Secondary | ICD-10-CM | POA: Diagnosis not present

## 2020-11-09 DIAGNOSIS — I129 Hypertensive chronic kidney disease with stage 1 through stage 4 chronic kidney disease, or unspecified chronic kidney disease: Secondary | ICD-10-CM | POA: Diagnosis not present

## 2020-11-10 DIAGNOSIS — J159 Unspecified bacterial pneumonia: Secondary | ICD-10-CM | POA: Diagnosis not present

## 2020-11-10 DIAGNOSIS — R0600 Dyspnea, unspecified: Secondary | ICD-10-CM | POA: Diagnosis not present

## 2020-11-10 DIAGNOSIS — R051 Acute cough: Secondary | ICD-10-CM | POA: Diagnosis not present

## 2020-12-30 DIAGNOSIS — E1121 Type 2 diabetes mellitus with diabetic nephropathy: Secondary | ICD-10-CM | POA: Diagnosis not present

## 2020-12-30 DIAGNOSIS — N400 Enlarged prostate without lower urinary tract symptoms: Secondary | ICD-10-CM | POA: Diagnosis not present

## 2020-12-30 DIAGNOSIS — E785 Hyperlipidemia, unspecified: Secondary | ICD-10-CM | POA: Diagnosis not present

## 2020-12-30 DIAGNOSIS — I10 Essential (primary) hypertension: Secondary | ICD-10-CM | POA: Diagnosis not present

## 2021-01-04 DIAGNOSIS — F5221 Male erectile disorder: Secondary | ICD-10-CM | POA: Diagnosis not present

## 2021-01-04 DIAGNOSIS — R7303 Prediabetes: Secondary | ICD-10-CM | POA: Diagnosis not present

## 2021-01-04 DIAGNOSIS — R600 Localized edema: Secondary | ICD-10-CM | POA: Diagnosis not present

## 2021-01-04 DIAGNOSIS — M5441 Lumbago with sciatica, right side: Secondary | ICD-10-CM | POA: Diagnosis not present

## 2021-01-04 DIAGNOSIS — E785 Hyperlipidemia, unspecified: Secondary | ICD-10-CM | POA: Diagnosis not present

## 2021-01-04 DIAGNOSIS — I10 Essential (primary) hypertension: Secondary | ICD-10-CM | POA: Diagnosis not present

## 2021-01-04 DIAGNOSIS — N182 Chronic kidney disease, stage 2 (mild): Secondary | ICD-10-CM | POA: Diagnosis not present

## 2021-01-04 DIAGNOSIS — E1121 Type 2 diabetes mellitus with diabetic nephropathy: Secondary | ICD-10-CM | POA: Diagnosis not present

## 2021-01-04 DIAGNOSIS — N400 Enlarged prostate without lower urinary tract symptoms: Secondary | ICD-10-CM | POA: Diagnosis not present

## 2021-01-06 DIAGNOSIS — I10 Essential (primary) hypertension: Secondary | ICD-10-CM | POA: Diagnosis not present

## 2021-01-06 DIAGNOSIS — K219 Gastro-esophageal reflux disease without esophagitis: Secondary | ICD-10-CM | POA: Diagnosis not present

## 2021-02-09 ENCOUNTER — Other Ambulatory Visit (HOSPITAL_COMMUNITY): Payer: Self-pay | Admitting: Family Medicine

## 2021-02-09 ENCOUNTER — Ambulatory Visit (HOSPITAL_COMMUNITY)
Admission: RE | Admit: 2021-02-09 | Discharge: 2021-02-09 | Disposition: A | Discharge status: Home / Self Care | Payer: Medicare HMO | Source: Ambulatory Visit | Attending: Family Medicine | Admitting: Family Medicine

## 2021-02-09 ENCOUNTER — Other Ambulatory Visit: Payer: Self-pay

## 2021-02-09 DIAGNOSIS — J189 Pneumonia, unspecified organism: Secondary | ICD-10-CM | POA: Diagnosis not present

## 2021-02-09 DIAGNOSIS — E785 Hyperlipidemia, unspecified: Secondary | ICD-10-CM | POA: Diagnosis not present

## 2021-02-09 DIAGNOSIS — R0600 Dyspnea, unspecified: Secondary | ICD-10-CM | POA: Diagnosis not present

## 2021-02-09 DIAGNOSIS — I10 Essential (primary) hypertension: Secondary | ICD-10-CM | POA: Diagnosis not present

## 2021-03-08 ENCOUNTER — Inpatient Hospital Stay (HOSPITAL_COMMUNITY)
Admission: EM | Admit: 2021-03-08 | Discharge: 2021-03-12 | DRG: 291 | Disposition: A | Discharge status: Home / Self Care | Payer: Medicare HMO | Attending: Internal Medicine | Admitting: Internal Medicine

## 2021-03-08 ENCOUNTER — Encounter (HOSPITAL_COMMUNITY): Payer: Self-pay | Admitting: *Deleted

## 2021-03-08 ENCOUNTER — Other Ambulatory Visit: Payer: Self-pay

## 2021-03-08 ENCOUNTER — Emergency Department (HOSPITAL_COMMUNITY): Payer: Medicare HMO

## 2021-03-08 DIAGNOSIS — Z79899 Other long term (current) drug therapy: Secondary | ICD-10-CM

## 2021-03-08 DIAGNOSIS — I5030 Unspecified diastolic (congestive) heart failure: Secondary | ICD-10-CM | POA: Diagnosis not present

## 2021-03-08 DIAGNOSIS — E876 Hypokalemia: Secondary | ICD-10-CM | POA: Diagnosis present

## 2021-03-08 DIAGNOSIS — I10 Essential (primary) hypertension: Secondary | ICD-10-CM | POA: Diagnosis not present

## 2021-03-08 DIAGNOSIS — Z20822 Contact with and (suspected) exposure to covid-19: Secondary | ICD-10-CM | POA: Diagnosis present

## 2021-03-08 DIAGNOSIS — R778 Other specified abnormalities of plasma proteins: Secondary | ICD-10-CM

## 2021-03-08 DIAGNOSIS — Z8249 Family history of ischemic heart disease and other diseases of the circulatory system: Secondary | ICD-10-CM | POA: Diagnosis not present

## 2021-03-08 DIAGNOSIS — R7989 Other specified abnormal findings of blood chemistry: Secondary | ICD-10-CM

## 2021-03-08 DIAGNOSIS — I110 Hypertensive heart disease with heart failure: Secondary | ICD-10-CM | POA: Diagnosis not present

## 2021-03-08 DIAGNOSIS — I5031 Acute diastolic (congestive) heart failure: Secondary | ICD-10-CM | POA: Diagnosis not present

## 2021-03-08 DIAGNOSIS — E669 Obesity, unspecified: Secondary | ICD-10-CM | POA: Diagnosis not present

## 2021-03-08 DIAGNOSIS — R0602 Shortness of breath: Secondary | ICD-10-CM

## 2021-03-08 DIAGNOSIS — E782 Mixed hyperlipidemia: Secondary | ICD-10-CM

## 2021-03-08 DIAGNOSIS — Z6835 Body mass index (BMI) 35.0-35.9, adult: Secondary | ICD-10-CM

## 2021-03-08 DIAGNOSIS — I422 Other hypertrophic cardiomyopathy: Secondary | ICD-10-CM | POA: Diagnosis not present

## 2021-03-08 DIAGNOSIS — N400 Enlarged prostate without lower urinary tract symptoms: Secondary | ICD-10-CM | POA: Diagnosis not present

## 2021-03-08 DIAGNOSIS — N182 Chronic kidney disease, stage 2 (mild): Secondary | ICD-10-CM | POA: Diagnosis not present

## 2021-03-08 DIAGNOSIS — Z7982 Long term (current) use of aspirin: Secondary | ICD-10-CM

## 2021-03-08 DIAGNOSIS — I509 Heart failure, unspecified: Secondary | ICD-10-CM | POA: Diagnosis not present

## 2021-03-08 DIAGNOSIS — I248 Other forms of acute ischemic heart disease: Secondary | ICD-10-CM | POA: Diagnosis not present

## 2021-03-08 LAB — CBC WITH DIFFERENTIAL/PLATELET
Abs Immature Granulocytes: 0.02 10*3/uL (ref 0.00–0.07)
Basophils Absolute: 0 10*3/uL (ref 0.0–0.1)
Basophils Relative: 0 %
Eosinophils Absolute: 0.1 10*3/uL (ref 0.0–0.5)
Eosinophils Relative: 1 %
HCT: 40.1 % (ref 39.0–52.0)
Hemoglobin: 13.8 g/dL (ref 13.0–17.0)
Immature Granulocytes: 0 %
Lymphocytes Relative: 13 %
Lymphs Abs: 1 10*3/uL (ref 0.7–4.0)
MCH: 31.4 pg (ref 26.0–34.0)
MCHC: 34.4 g/dL (ref 30.0–36.0)
MCV: 91.1 fL (ref 80.0–100.0)
Monocytes Absolute: 0.6 10*3/uL (ref 0.1–1.0)
Monocytes Relative: 8 %
Neutro Abs: 5.8 10*3/uL (ref 1.7–7.7)
Neutrophils Relative %: 78 %
Platelets: 244 10*3/uL (ref 150–400)
RBC: 4.4 MIL/uL (ref 4.22–5.81)
RDW: 12.6 % (ref 11.5–15.5)
WBC: 7.5 10*3/uL (ref 4.0–10.5)
nRBC: 0 % (ref 0.0–0.2)

## 2021-03-08 LAB — COMPREHENSIVE METABOLIC PANEL
ALT: 17 U/L (ref 0–44)
AST: 19 U/L (ref 15–41)
Albumin: 3.9 g/dL (ref 3.5–5.0)
Alkaline Phosphatase: 64 U/L (ref 38–126)
Anion gap: 7 (ref 5–15)
BUN: 19 mg/dL (ref 8–23)
CO2: 23 mmol/L (ref 22–32)
Calcium: 8.7 mg/dL — ABNORMAL LOW (ref 8.9–10.3)
Chloride: 109 mmol/L (ref 98–111)
Creatinine, Ser: 1.06 mg/dL (ref 0.61–1.24)
GFR, Estimated: >60 mL/min (ref >60)
Glucose, Bld: 105 mg/dL — ABNORMAL HIGH (ref 70–99)
Potassium: 3.9 mmol/L (ref 3.5–5.1)
Sodium: 139 mmol/L (ref 135–145)
Total Bilirubin: 0.5 mg/dL (ref 0.3–1.2)
Total Protein: 6.6 g/dL (ref 6.5–8.1)

## 2021-03-08 LAB — TROPONIN I (HIGH SENSITIVITY)
Troponin I (High Sensitivity): 19 ng/L — ABNORMAL HIGH (ref <18)
Troponin I (High Sensitivity): 18 ng/L — ABNORMAL HIGH (ref <18)

## 2021-03-08 LAB — RESP PANEL BY RT-PCR (FLU A&B, COVID) ARPGX2
Influenza A by PCR: NEGATIVE
Influenza B by PCR: NEGATIVE
SARS Coronavirus 2 by RT PCR: NEGATIVE

## 2021-03-08 LAB — BRAIN NATRIURETIC PEPTIDE: B Natriuretic Peptide: 186 pg/mL — ABNORMAL HIGH (ref 0.0–100.0)

## 2021-03-08 MED ORDER — FUROSEMIDE 10 MG/ML IJ SOLN
40.0000 mg | Freq: Two times a day (BID) | INTRAMUSCULAR | Status: DC
  Administered 2021-03-09 – 2021-03-11 (×5): 40 mg via INTRAVENOUS
  Filled 2021-03-08 (×5): qty 4

## 2021-03-08 MED ORDER — PRAVASTATIN SODIUM 40 MG PO TABS
40.0000 mg | ORAL_TABLET | Freq: Every day | ORAL | Status: DC
  Filled 2021-03-08: qty 1

## 2021-03-08 MED ORDER — ENOXAPARIN SODIUM 60 MG/0.6ML IJ SOSY
60.0000 mg | PREFILLED_SYRINGE | Freq: Every day | INTRAMUSCULAR | Status: DC
  Administered 2021-03-08 – 2021-03-11 (×4): 60 mg via SUBCUTANEOUS
  Filled 2021-03-08 (×4): qty 0.6

## 2021-03-08 MED ORDER — FUROSEMIDE 10 MG/ML IJ SOLN
40.0000 mg | Freq: Once | INTRAMUSCULAR | Status: AC
  Administered 2021-03-08: 40 mg via INTRAVENOUS
  Filled 2021-03-08: qty 4

## 2021-03-08 MED ORDER — LOSARTAN POTASSIUM 50 MG PO TABS
50.0000 mg | ORAL_TABLET | Freq: Two times a day (BID) | ORAL | Status: DC
  Administered 2021-03-08 – 2021-03-10 (×4): 50 mg via ORAL
  Filled 2021-03-08 (×4): qty 1

## 2021-03-08 MED ORDER — ASPIRIN 81 MG PO CHEW
324.0000 mg | CHEWABLE_TABLET | Freq: Once | ORAL | Status: AC
  Administered 2021-03-08: 324 mg via ORAL
  Filled 2021-03-08: qty 4

## 2021-03-08 MED ORDER — FINASTERIDE 5 MG PO TABS
5.0000 mg | ORAL_TABLET | Freq: Every day | ORAL | Status: DC
  Administered 2021-03-09 – 2021-03-12 (×4): 5 mg via ORAL
  Filled 2021-03-08 (×4): qty 1

## 2021-03-08 MED ORDER — ALFUZOSIN HCL ER 10 MG PO TB24
10.0000 mg | ORAL_TABLET | Freq: Every day | ORAL | Status: DC
  Administered 2021-03-09 – 2021-03-12 (×4): 10 mg via ORAL
  Filled 2021-03-08 (×6): qty 1

## 2021-03-08 MED ORDER — ASPIRIN EC 81 MG PO TBEC
81.0000 mg | DELAYED_RELEASE_TABLET | Freq: Every day | ORAL | Status: DC
  Administered 2021-03-09 – 2021-03-12 (×4): 81 mg via ORAL
  Filled 2021-03-08 (×4): qty 1

## 2021-03-08 NOTE — ED Triage Notes (Signed)
Shortness of breath for months

## 2021-03-08 NOTE — H&P (Addendum)
History and Physical  [name] [id number] DOB: 06/29/1935 DOA: 03/08/2021  Referring physician: Sherwood Gambler, MD PCP: Celene Squibb, MD  Patient coming from: Home  Chief Complaint: Shortness of breath  HPI: Adam Banks is an 86 y.o. male with medical history significant for hypertension, hyperlipidemia, BPH who presents to the emergency department due to shortness of breath.  Patient complained of about 74-monthhistory of nasal stuffiness/facial congestion with cough.  He was treated with Flonase without improvement, patient states that he was unable to breathe in enough air on attempt to take a deep breath.  Symptoms are usually intermittent.  On attempt to go to his mailbox at home, shortness of breath worsened significantly.  Symptoms worsened today with sensation of  difficulty in being able to take a deep breath.  He endorsed no changes in his chronic leg swelling and denies fever, chills, cough, nausea, vomiting or abdominal pain.  ED Course:  In the emergency department, he was bradycardic, BP was 167/69, other vital signs were within normal range.  Work-up in the ED showed normal CBC and BMP, BNP was 186, troponin x1 was 19, influenza A, B, SARS coronavirus 2 was negative. Chest x-ray showed no active cardiopulmonary disease.   IV Lasix 40 mg x 1 was given, aspirin 324 mg p.o. x1 was given.  Hospitalist was asked to admit patient for further evaluation and management.  Review of Systems: Constitutional: Negative for chills and fever.  HENT: Negative for ear pain and sore throat.   Eyes: Negative for pain and visual disturbance.  Respiratory: Positive for shortness of breath..  Negative for cough, chest tightness  Cardiovascular: Positive for leg swelling.  Negative for chest pain and palpitations.  Gastrointestinal: Negative for abdominal pain and vomiting.  Endocrine: Negative for polyphagia and polyuria.  Genitourinary: Negative for decreased urine volume, dysuria,  enuresis Musculoskeletal: Negative for arthralgias and back pain.  Skin: Negative for color change and rash.  Allergic/Immunologic: Negative for immunocompromised state.  Neurological: Negative for tremors, syncope, speech difficulty Hematological: Does not bruise/bleed easily.  All other systems reviewed and are negative  Past Medical History:  Diagnosis Date   Enlarged prostate 2014   History of urinary hesitancy    Hypertension    Past Surgical History:  Procedure Laterality Date   HYDROCELE EXCISION Left 05/05/2014   Procedure: LEFT HYDROCELECTOMY ADULT;  Surgeon: SJorja Loa MD;  Location: [facility];  Service: Urology;  Laterality: Left;   INGUINAL HERNIA REPAIR Right 1977   TONSILLECTOMY      Social History:  reports that he has never smoked. He has never used smokeless tobacco. He reports that he does not drink alcohol and does not use drugs.   Allergies  Allergen Reactions   Flomax [Tamsulosin Hcl] Other (See Comments)    Patient states that he passed out.    Family History  Problem Relation Age of Onset   Hodgkin's lymphoma Mother    Heart attack Father      Prior to Admission medications   Medication Sig Start Date End Date Taking? Authorizing Provider  alfuzosin (UROXATRAL) 10 MG 24 hr tablet Take 1 tablet (10 mg total) by mouth daily with breakfast. Patient taking differently: Take 10 mg by mouth every evening. 06/16/20  Yes Dahlstedt, SAnnie Main MD  finasteride (PROSCAR) 5 MG tablet Take 5 mg by mouth daily.   Yes [provider]  losartan (COZAAR) 100 MG tablet Take 50 mg by mouth 2 (two) times daily.  Yes [provider]  naproxen sodium (ALEVE) 220 MG tablet Take 220 mg by mouth daily as needed (pain).   Yes [provider]  pravastatin (PRAVACHOL) 40 MG tablet Take 40 mg by mouth daily. Mondays and Thursday only   Yes [provider]  SYMBICORT 80-4.5 MCG/ACT inhaler Inhale 1 puff into the lungs 2 (two) times daily.  02/10/21  Yes [provider]  alfuzosin (UROXATRAL) 10 MG 24 hr tablet  12/16/19   [provider]  fenofibrate 160 MG tablet Take 160 mg by mouth daily. Patient not taking: No sig reported 01/14/21   [provider]  rosuvastatin (CRESTOR) 40 MG tablet Take 1 tablet (40 mg total) by mouth daily. Patient not taking: No sig reported 05/12/19   Fay Records, MD    Physical Exam: BP (!) 167/69 (BP Location: Left Arm)   Pulse 65   Temp 98 F (36.7 C) (Oral)   Resp 18   SpO2 99%   General: 86 y.o. year-old male well developed well nourished in no acute distress.  Alert and oriented x3. HEENT: NCAT, EOMI Neck: Supple, trachea medial Cardiovascular: Regular rate and rhythm with no rubs or gallops.  No thyromegaly or JVD noted.  2/4 pulses in all 4 extremities. Respiratory: Mild crackles in RLL on auscultation with no wheezes . Abdomen: Soft, nontender nondistended with normal bowel sounds x4 quadrants. Muskuloskeletal: Bilateral lower extremity edema.  No cyanosis or clubbing noted bilaterally Neuro: CN II-XII intact, strength 5/5 x 4, sensation, reflexes intact Skin: No ulcerative lesions noted or rashes Psychiatry: Judgement and insight appear normal. Mood is appropriate for condition and setting          Labs on Admission:  Basic Metabolic Panel: Recent Labs  Lab 03/08/21 1737  NA 139  K 3.9  CL 109  CO2 23  GLUCOSE 105*  BUN 19  CREATININE 1.06  CALCIUM 8.7*   Liver Function Tests: Recent Labs  Lab 03/08/21 1737  AST 19  ALT 17  ALKPHOS 64  BILITOT 0.5  PROT 6.6  ALBUMIN 3.9   No results for input(s): LIPASE, AMYLASE in the last 168 hours. No results for input(s): AMMONIA in the last 168 hours. CBC: Recent Labs  Lab 03/08/21 1737  WBC 7.5  NEUTROABS 5.8  HGB 13.8  HCT 40.1  MCV 91.1  PLT 244   Cardiac Enzymes: No results for input(s): CKTOTAL, CKMB, CKMBINDEX, TROPONINI in the last 168 hours.  BNP (last 3 results) Recent Labs     03/08/21 1737  BNP 186.0*    ProBNP (last 3 results) No results for input(s): PROBNP in the last 8760 hours.  CBG: No results for input(s): GLUCAP in the last 168 hours.  Radiological Exams on Admission: DG Chest 2 View  Result Date: 03/08/2021 CLINICAL DATA:  Shortness of breath and head congestion. EXAM: CHEST - 2 VIEW COMPARISON:  February 09, 2021 FINDINGS: There is no evidence of acute infiltrate, pleural effusion or pneumothorax. Stable elevation of the right hemidiaphragm is seen. The heart size and mediastinal contours are within normal limits. Degenerative changes seen throughout the thoracic spine. IMPRESSION: Stable exam without active cardiopulmonary disease. Electronically Signed   By: Virgina Norfolk M.D.   On: 03/08/2021 18:51    EKG: I independently viewed the EKG done and my findings are as followed: Sinus bradycardia at a rate of 55 bpm  Assessment/Plan Present on Admission: **None**  Principal Problem:   CHF (congestive heart failure) (HCC) Active Problems:  Elevated troponin   Essential hypertension   Mixed hyperlipidemia   BPH (benign prostatic hyperplasia)   Elevated brain natriuretic peptide (BNP) level   Elevated BNP R/O new onset CHF BNP 186 Continue total input/output, daily weights and fluid restriction Aspirin 324 mg p.o. x1 and IV Lasix 40 mg x 1 was given in the ED.  Continue IV Lasix 40 mg twice daily Continue Cardiac diet  EKG showed sinus bradycardia at a rate of 55 bpm Echocardiogram will be done in the morning   Nasal stuffiness rule out chronic rhinosinusitis (CRS) Patient states that he has used Flonase with only transitory relief; apparently, patient was using these several times in the day and it was uncertain if method of administration was appropriate. Patient complained of nasal/facial fullness and scratchy throat, but denies facial pain Repeat Flonase twice daily and saline nasal spray  Elevated troponin possibly secondary to  type II demand ischemia Troponin x2-19 > 18 Continue telemetry EKG personally reviewed showed sinus bradycardia at a rate of 55 beats minute. Aspirin 325 mg x 1 was given in the ED, continue aspirin 81 mg p.o. daily.   Essential hypertension (uncontrolled), Continue losartan  Mixed hyperlipidemia Continue pravastatin  BPH Continue finasteride and alfuzosin  DVT prophylaxis: Lovenox, SCDs  Code Status: Full code  Family Communication: None at bedside  Disposition Plan:  Patient is from:                        home Anticipated DC to:                   SNF or family members home Anticipated DC date:               2-3 days Anticipated DC barriers:          *Patient requires inpatient management due to presumed new onset CHF requiring further work-up.  Consults called: None  Admission status: Inpatient   Bernadette Hoit MD [facility]  03/08/2021, 8:18 PM

## 2021-03-08 NOTE — ED Provider Notes (Signed)
[facility] Provider Note   CSN: [id number] Arrival date & time: 03/08/21  1540     History Chief Complaint  Patient presents with  . Shortness of Breath    Adam Banks is a 86 y.o. male.  HPI 86 year old male presents with dyspnea.  He has been having dyspnea and feeling like he is having nasal stuffiness/facial congestion since April.  However it seemed to get worse today where he feels like there is a heaviness/pressure to his chest where he cannot get a good full breath.  He states is not really a pain in his chest but more difficulty getting a full breath.  No cough or fever.  He has chronic leg swelling that is unchanged.  Past Medical History:  Diagnosis Date  . Enlarged prostate 2014  . History of urinary hesitancy   . Hypertension     Patient Active Problem List   Diagnosis Date Noted  . CHF (congestive heart failure) ([place]) 03/08/2021  . Elevated troponin 03/08/2021  . Essential hypertension 03/08/2021  . Mixed hyperlipidemia 03/08/2021  . BPH (benign prostatic hyperplasia) 03/08/2021  . Elevated brain natriuretic peptide (BNP) level 03/08/2021    Past Surgical History:  Procedure Laterality Date  . HYDROCELE EXCISION Left 05/05/2014   Procedure: LEFT HYDROCELECTOMY ADULT;  Surgeon: Jorja Loa, MD;  Location: [facility];  Service: Urology;  Laterality: Left;  . INGUINAL HERNIA REPAIR Right 1977  . TONSILLECTOMY         Family History  Problem Relation Age of Onset  . Hodgkin's lymphoma Mother   . Heart attack Father     Social History   Tobacco Use  . Smoking status: Never  . Smokeless tobacco: Never  Vaping Use  . Vaping Use: Never used  Substance Use Topics  . Alcohol use: No  . Drug use: No    Home Medications Prior to Admission medications   Medication Sig Start Date End Date Taking? Authorizing Provider  alfuzosin (UROXATRAL) 10 MG 24 hr tablet Take 1 tablet (10 mg total) by mouth daily with  breakfast. Patient taking differently: Take 10 mg by mouth every evening. 06/16/20  Yes Dahlstedt, Annie Main, MD  finasteride (PROSCAR) 5 MG tablet Take 5 mg by mouth daily.   Yes [provider]  losartan (COZAAR) 100 MG tablet Take 50 mg by mouth 2 (two) times daily.   Yes [provider]  naproxen sodium (ALEVE) 220 MG tablet Take 220 mg by mouth daily as needed (pain).   Yes [provider]  pravastatin (PRAVACHOL) 40 MG tablet Take 40 mg by mouth daily. Mondays and Thursday only   Yes [provider]  SYMBICORT 80-4.5 MCG/ACT inhaler Inhale 1 puff into the lungs 2 (two) times daily. 02/10/21  Yes [provider]  alfuzosin (UROXATRAL) 10 MG 24 hr tablet  12/16/19   [provider]  fenofibrate 160 MG tablet Take 160 mg by mouth daily. Patient not taking: No sig reported 01/14/21   [provider]  rosuvastatin (CRESTOR) 40 MG tablet Take 1 tablet (40 mg total) by mouth daily. Patient not taking: No sig reported 05/12/19   Fay Records, MD    Allergies    Flomax [tamsulosin hcl]  Review of Systems   Review of Systems  Constitutional:  Negative for fever.  HENT:  Positive for congestion.   Respiratory:  Positive for shortness of breath. Negative for cough.   Cardiovascular:  Positive for chest pain and leg swelling.  All other systems reviewed and are negative.  Physical Exam Updated Vital Signs BP (!) 182/62 (BP Location: Right Arm)   Pulse (!) 58   Temp 97.9 F (36.6 C) (Oral)   Resp 20   Ht 6' (1.829 m)   Wt 120.6 kg   SpO2 98%   BMI 36.06 kg/m   Physical Exam Vitals and nursing note reviewed.  Constitutional:      General: He is not in acute distress.    Appearance: He is well-developed. He is obese. He is not ill-appearing or diaphoretic.  HENT:     Head: Normocephalic and atraumatic.     Right Ear: External ear normal.     Left Ear: External ear normal.     Nose: Nose normal.  Eyes:     General:         Right eye: No discharge.        Left eye: No discharge.  Cardiovascular:     Rate and Rhythm: Normal rate and regular rhythm.     Heart sounds: Normal heart sounds.  Pulmonary:     Effort: Pulmonary effort is normal.     Breath sounds: Examination of the right-lower field reveals rales. Rales (faint) present.  Abdominal:     Palpations: Abdomen is soft.     Tenderness: There is no abdominal tenderness.  Musculoskeletal:     Cervical back: Neck supple.     Right lower leg: Edema present.     Left lower leg: Edema present.     Comments: Symmetric BLE edema  Skin:    General: Skin is warm and dry.  Neurological:     Mental Status: He is alert.  Psychiatric:        Mood and Affect: Mood is not anxious.    ED Results / Procedures / Treatments   Labs (all labs ordered are listed, but only abnormal results are displayed) Labs Reviewed  COMPREHENSIVE METABOLIC PANEL - Abnormal; Notable for the following components:      Result Value   Glucose, Bld 105 (*)    Calcium 8.7 (*)    All other components within normal limits  BRAIN NATRIURETIC PEPTIDE - Abnormal; Notable for the following components:   B Natriuretic Peptide 186.0 (*)    All other components within normal limits  TROPONIN I (HIGH SENSITIVITY) - Abnormal; Notable for the following components:   Troponin I (High Sensitivity) 19 (*)    All other components within normal limits  TROPONIN I (HIGH SENSITIVITY) - Abnormal; Notable for the following components:   Troponin I (High Sensitivity) 18 (*)    All other components within normal limits  RESP PANEL BY RT-PCR (FLU A&B, COVID) ARPGX2  CBC WITH DIFFERENTIAL/PLATELET  COMPREHENSIVE METABOLIC PANEL  CBC  APTT  PROTIME-INR  MAGNESIUM  PHOSPHORUS    EKG EKG Interpretation  Date/Time:  Tuesday March 08 2021 19:19:04 EDT Ventricular Rate:  55 PR Interval:  172 QRS Duration: 99 QT Interval:  455 QTC Calculation: 436 R Axis:   17 Text Interpretation: Sinus rhythm  Borderline T abnormalities, inferior leads similar to 2018 Confirmed by Sherwood Gambler [phone]) on 03/08/2021 7:21:21 PM  Radiology DG Chest 2 View  Result Date: 03/08/2021 CLINICAL DATA:  Shortness of breath and head congestion. EXAM: CHEST - 2 VIEW COMPARISON:  February 09, 2021 FINDINGS: There is no evidence of acute infiltrate, pleural effusion or pneumothorax. Stable elevation of the right hemidiaphragm is seen. The heart size and mediastinal contours are within normal limits.  Degenerative changes seen throughout the thoracic spine. IMPRESSION: Stable exam without active cardiopulmonary disease. Electronically Signed   By: Virgina Norfolk M.D.   On: 03/08/2021 18:51    Procedures Procedures   Medications Ordered in ED Medications  enoxaparin (LOVENOX) injection 60 mg (60 mg Subcutaneous Given 03/08/21 2354)  furosemide (LASIX) injection 40 mg (has no administration in time range)  aspirin EC tablet 81 mg (has no administration in time range)  losartan (COZAAR) tablet 50 mg (50 mg Oral Given 03/08/21 2353)  pravastatin (PRAVACHOL) tablet 40 mg (has no administration in time range)  finasteride (PROSCAR) tablet 5 mg (has no administration in time range)  alfuzosin (UROXATRAL) 24 hr tablet 10 mg (has no administration in time range)  furosemide (LASIX) injection 40 mg (40 mg Intravenous Given 03/08/21 2020)  aspirin chewable tablet 324 mg (324 mg Oral Given 03/08/21 2012)    ED Course  I have reviewed the triage vital signs and the nursing notes.  Pertinent labs & imaging results that were available during my care of the patient were reviewed by me and considered in my medical decision making (see chart for details).    MDM Rules/Calculators/A&P                           I suspect patient probably has new onset mild CHF.  He has chronic leg swelling but is not on diuretics and does not apparently have a prior diagnosis of CHF.  Troponin is mildly elevated but likely from the CHF rather  than ACS.  We will give a dose of Lasix.  Discussed options with patient and wife, will admit for further work-up and monitoring. Final Clinical Impression(s) / ED Diagnoses Final diagnoses:  Acute congestive heart failure, unspecified heart failure type [facility])    Rx / DC Orders ED Discharge Orders     None        Sherwood Gambler, MD 03/08/21 2357

## 2021-03-08 NOTE — ED Notes (Signed)
States it is more of head congestion

## 2021-03-09 ENCOUNTER — Inpatient Hospital Stay (HOSPITAL_COMMUNITY): Payer: Medicare HMO

## 2021-03-09 DIAGNOSIS — I5030 Unspecified diastolic (congestive) heart failure: Secondary | ICD-10-CM | POA: Diagnosis not present

## 2021-03-09 DIAGNOSIS — N182 Chronic kidney disease, stage 2 (mild): Secondary | ICD-10-CM | POA: Diagnosis not present

## 2021-03-09 DIAGNOSIS — R0602 Shortness of breath: Secondary | ICD-10-CM

## 2021-03-09 DIAGNOSIS — N400 Enlarged prostate without lower urinary tract symptoms: Secondary | ICD-10-CM

## 2021-03-09 DIAGNOSIS — I10 Essential (primary) hypertension: Secondary | ICD-10-CM | POA: Diagnosis not present

## 2021-03-09 LAB — COMPREHENSIVE METABOLIC PANEL
ALT: 17 U/L (ref 0–44)
AST: 21 U/L (ref 15–41)
Albumin: 3.7 g/dL (ref 3.5–5.0)
Alkaline Phosphatase: 61 U/L (ref 38–126)
Anion gap: 10 (ref 5–15)
BUN: 18 mg/dL (ref 8–23)
CO2: 26 mmol/L (ref 22–32)
Calcium: 9 mg/dL (ref 8.9–10.3)
Chloride: 106 mmol/L (ref 98–111)
Creatinine, Ser: 1 mg/dL (ref 0.61–1.24)
GFR, Estimated: >60 mL/min (ref >60)
Glucose, Bld: 119 mg/dL — ABNORMAL HIGH (ref 70–99)
Potassium: 3.7 mmol/L (ref 3.5–5.1)
Sodium: 142 mmol/L (ref 135–145)
Total Bilirubin: 0.7 mg/dL (ref 0.3–1.2)
Total Protein: 6.6 g/dL (ref 6.5–8.1)

## 2021-03-09 LAB — PROTIME-INR
INR: 1 (ref 0.8–1.2)
Prothrombin Time: 13.6 seconds (ref 11.4–15.2)

## 2021-03-09 LAB — ECHOCARDIOGRAM COMPLETE
AR max vel: 1.74 cm2
AV Area VTI: 1.82 cm2
AV Area mean vel: 1.84 cm2
AV Mean grad: 6 mmHg
AV Peak grad: 11 mmHg
Ao pk vel: 1.66 m/s
Area-P 1/2: 2.49 cm2
Height: 72 in
MV VTI: 2.78 cm2
S' Lateral: 2.98 cm
Weight: 4222.25 oz

## 2021-03-09 LAB — CBC
HCT: 40.7 % (ref 39.0–52.0)
Hemoglobin: 13.9 g/dL (ref 13.0–17.0)
MCH: 31 pg (ref 26.0–34.0)
MCHC: 34.2 g/dL (ref 30.0–36.0)
MCV: 90.8 fL (ref 80.0–100.0)
Platelets: 232 10*3/uL (ref 150–400)
RBC: 4.48 MIL/uL (ref 4.22–5.81)
RDW: 12.5 % (ref 11.5–15.5)
WBC: 6.8 10*3/uL (ref 4.0–10.5)
nRBC: 0 % (ref 0.0–0.2)

## 2021-03-09 LAB — PHOSPHORUS: Phosphorus: 3.4 mg/dL (ref 2.5–4.6)

## 2021-03-09 LAB — APTT: aPTT: 31 seconds (ref 24–36)

## 2021-03-09 LAB — MAGNESIUM: Magnesium: 1.9 mg/dL (ref 1.7–2.4)

## 2021-03-09 MED ORDER — SALINE SPRAY 0.65 % NA SOLN
1.0000 | NASAL | Status: DC | PRN
  Filled 2021-03-09: qty 44

## 2021-03-09 MED ORDER — FLUTICASONE PROPIONATE 50 MCG/ACT NA SUSP
1.0000 | Freq: Two times a day (BID) | NASAL | Status: DC
  Administered 2021-03-09 – 2021-03-12 (×6): 1 via NASAL
  Filled 2021-03-09: qty 16

## 2021-03-09 MED ORDER — PRAVASTATIN SODIUM 40 MG PO TABS
40.0000 mg | ORAL_TABLET | ORAL | Status: DC
  Administered 2021-03-10: 40 mg via ORAL
  Filled 2021-03-09: qty 1

## 2021-03-09 NOTE — Progress Notes (Signed)
*  PRELIMINARY RESULTS* Echocardiogram 2D Echocardiogram has been performed.  Adam Banks 03/09/2021, 1:51 PM

## 2021-03-09 NOTE — Progress Notes (Signed)
PROGRESS NOTE    [name]  [id number] DOB: 03-19-1935 DOA: 03/08/2021 PCP: Celene Squibb, MD   Brief Narrative:  Adam Banks is an 86 y.o. WM PMHx  hypertension, hyperlipidemia, BPH   Presents to the emergency department due to shortness of breath.  Patient complained of about 21-monthhistory of nasal stuffiness/facial congestion with cough.  He was treated with Flonase without improvement, patient states that he was unable to breathe in enough air on attempt to take a deep breath.  Symptoms are usually intermittent.  On attempt to go to his mailbox at home, shortness of breath worsened significantly.  Symptoms worsened today with sensation of  difficulty in being able to take a deep breath.  He endorsed no changes in his chronic leg swelling and denies fever, chills, cough, nausea, vomiting or abdominal pain.   ED Course:  In the emergency department, he was bradycardic, BP was 167/69, other vital signs were within normal range.  Work-up in the ED showed normal CBC and BMP, BNP was 186, troponin x1 was 19, influenza A, B, SARS coronavirus 2 was negative. Chest x-ray showed no active cardiopulmonary disease.   IV Lasix 40 mg x 1 was given, aspirin 324 mg p.o. x1 was given.  Hospitalist was asked to admit patient for further evaluation and management.   Subjective: Afebrile overnight, BP better controlled.   Assessment & Plan:  Covid vaccination;  Principal Problem:   CHF (congestive heart failure) (HCC) Active Problems:   Elevated troponin   Essential hypertension   Mixed hyperlipidemia   BPH (benign prostatic hyperplasia)   Elevated brain natriuretic peptide (BNP) level   Elevated BNP R/O new onset CHF BNP 186 Continue total input/output, daily weights and fluid restriction Aspirin 324 mg p.o. x1 and IV Lasix 40 mg x 1 was given in the ED.  Continue IV Lasix 40 mg twice daily Continue Cardiac diet             EKG showed sinus bradycardia at a rate of 55  bpm Echocardiogram will be done in the morning    Nasal stuffiness rule out chronic rhinosinusitis (CRS) Patient states that he has used Flonase with only transitory relief; apparently, patient was using these several times in the day and it was uncertain if method of administration was appropriate. Patient complained of nasal/facial fullness and scratchy throat, but denies facial pain Repeat Flonase twice daily and saline nasal spray  Elevated troponin possibly secondary to type II demand ischemia Troponin x2-19 > 18 Continue telemetry EKG personally reviewed showed sinus bradycardia at a rate of 55 beats minute. Aspirin 325 mg x 1 was given in the ED, continue aspirin 81 mg p.o. daily.   Essential hypertension (uncontrolled), Continue losartan  Mixed hyperlipidemia Continue pravastatin  BPH Continue finasteride and alfuzosin  Obese (BMI 35.79 kg/m)   DVT prophylaxis: Lovenox Code Status: Full Family Communication: 8/31 wife at bedside for discussion of plan of care all questions addressed Status is: Inpatient    Dispo: The patient is from: Home              Anticipated d/c is to: Home              Anticipated d/c date is: 2 days              Patient currently is not medically stable to d/c.      Consultants:  Cardiology  Procedures/Significant Events:  8/31 Echocardiogram pending   I have personally  reviewed and interpreted all radiology studies and my findings are as above.  VENTILATOR SETTINGS:    Cultures   Antimicrobials:    Devices    LINES / TUBES:      Continuous Infusions:   Objective: Vitals:   03/08/21 2136 03/09/21 0434 03/09/21 0700 03/09/21 1506  BP: (!) 182/62 (!) 166/62  (!) 124/55  Pulse: (!) 58 (!) 57  63  Resp: '20 20  18  '$ Temp: 97.9 F (36.6 C) 98.3 F (36.8 C)  98.4 F (36.9 C)  TempSrc: Oral   Oral  SpO2: 98% 99%  97%  Weight: 120.6 kg  119.7 kg   Height: 6' (1.829 m)       Intake/Output Summary (Last 24  hours) at 03/09/2021 1531 Last data filed at 03/09/2021 0900 Gross per 24 hour  Intake 680 ml  Output 2200 ml  Net -1520 ml   Filed Weights   03/08/21 2136 03/09/21 0700  Weight: 120.6 kg 119.7 kg    Examination:  General: No acute respiratory distress Eyes: negative scleral hemorrhage, negative anisocoria, negative icterus ENT: Negative Runny nose, negative gingival bleeding, extremely hard of hearing Neck:  Negative scars, masses, torticollis, lymphadenopathy, JVD Lungs: Clear to auscultation bilaterally without wheezes or crackles Cardiovascular: Regular rate and rhythm without murmur gallop or rub normal S1 and S2 Abdomen: negative abdominal pain, nondistended, positive soft, bowel sounds, no rebound, no ascites, no appreciable mass Extremities: +2 - 3+ edema bilateral lower extremity to the knees (per wife usually much worse) Skin: Negative rashes, lesions, ulcers Psychiatric:  Negative depression, negative anxiety, negative fatigue, negative mania  Central nervous system:  Cranial nerves II through XII intact, tongue/uvula midline, all extremities muscle strength 5/5, sensation intact throughout,  negative dysarthria, negative expressive aphasia, negative receptive aphasia.  .     Data Reviewed: Care during the described time interval was provided by me .  I have reviewed this patient's available data, including medical history, events of note, physical examination, and all test results as part of my evaluation.   CBC: Recent Labs  Lab 03/08/21 1737 03/09/21 0543  WBC 7.5 6.8  NEUTROABS 5.8  --   HGB 13.8 13.9  HCT 40.1 40.7  MCV 91.1 90.8  PLT 244 [id number]   Basic Metabolic Panel: Recent Labs  Lab 03/08/21 1737 03/09/21 0543  NA 139 142  K 3.9 3.7  CL 109 106  CO2 23 26  GLUCOSE 105* 119*  BUN 19 18  CREATININE 1.06 1.00  CALCIUM 8.7* 9.0  MG  --  1.9  PHOS  --  3.4   GFR: Estimated Creatinine Clearance: 73.4 mL/min (by C-G formula based on SCr of 1  mg/dL). Liver Function Tests: Recent Labs  Lab 03/08/21 1737 03/09/21 0543  AST 19 21  ALT 17 17  ALKPHOS 64 61  BILITOT 0.5 0.7  PROT 6.6 6.6  ALBUMIN 3.9 3.7   No results for input(s): LIPASE, AMYLASE in the last 168 hours. No results for input(s): AMMONIA in the last 168 hours. Coagulation Profile: Recent Labs  Lab 03/09/21 0543  INR 1.0   Cardiac Enzymes: No results for input(s): CKTOTAL, CKMB, CKMBINDEX, TROPONINI in the last 168 hours. BNP (last 3 results) No results for input(s): PROBNP in the last 8760 hours. HbA1C: No results for input(s): HGBA1C in the last 72 hours. CBG: No results for input(s): GLUCAP in the last 168 hours. Lipid Profile: No results for input(s): CHOL, HDL, LDLCALC, TRIG, CHOLHDL, LDLDIRECT in the last 72  hours. Thyroid Function Tests: No results for input(s): TSH, T4TOTAL, FREET4, T3FREE, THYROIDAB in the last 72 hours. Anemia Panel: No results for input(s): VITAMINB12, FOLATE, FERRITIN, TIBC, IRON, RETICCTPCT in the last 72 hours. Urine analysis:    Component Value Date/Time   COLORURINE YELLOW 01/24/2017 2125   APPEARANCEUR Clear 05/04/2020 0947   LABSPEC 1.013 01/24/2017 2125   PHURINE 6.0 01/24/2017 2125   GLUCOSEU Negative 05/04/2020 0947   HGBUR SMALL (A) 01/24/2017 2125   BILIRUBINUR Negative 05/04/2020 [place] 01/24/2017 2125   PROTEINUR Negative 05/04/2020 0947   PROTEINUR NEGATIVE 01/24/2017 2125   UROBILINOGEN negative (A) 01/13/2020 0936   NITRITE Negative 05/04/2020 0947   NITRITE NEGATIVE 01/24/2017 2125   LEUKOCYTESUR Negative 05/04/2020 0947   Sepsis Labs: '@LABRCNTIP'$ (procalcitonin:4,lacticidven:4)  ) Recent Results (from the past 240 hour(s))  Resp Panel by RT-PCR (Flu A&B, Covid) Nasopharyngeal Swab     Status: None   Collection Time: 03/08/21  5:40 PM   Specimen: Nasopharyngeal Swab; Nasopharyngeal(NP) swabs in vial transport medium  Result Value Ref Range Status   SARS Coronavirus 2 by RT  PCR NEGATIVE NEGATIVE Final    Comment: (NOTE) SARS-CoV-2 target nucleic acids are NOT DETECTED.  The SARS-CoV-2 RNA is generally detectable in upper respiratory specimens during the acute phase of infection. The lowest concentration of SARS-CoV-2 viral copies this assay can detect is 138 copies/mL. A negative result does not preclude SARS-Cov-2 infection and should not be used as the sole basis for treatment or other patient management decisions. A negative result may occur with  improper specimen collection/handling, submission of specimen other than nasopharyngeal swab, presence of viral mutation(s) within the areas targeted by this assay, and inadequate number of viral copies(<138 copies/mL). A negative result must be combined with clinical observations, patient history, and epidemiological information. The expected result is Negative.  Fact Sheet for Patients:  EntrepreneurPulse.com.au  Fact Sheet for Healthcare Providers:  IncredibleEmployment.be  This test is no t yet approved or cleared by the [place] FDA and  has been authorized for detection and/or diagnosis of SARS-CoV-2 by FDA under an Emergency Use Authorization (EUA). This EUA will remain  in effect (meaning this test can be used) for the duration of the COVID-19 declaration under Section 564(b)(1) of the Act, 21 U.S.C.section 360bbb-3(b)(1), unless the authorization is terminated  or revoked sooner.       Influenza A by PCR NEGATIVE NEGATIVE Final   Influenza B by PCR NEGATIVE NEGATIVE Final    Comment: (NOTE) The Xpert Xpress SARS-CoV-2/FLU/RSV plus assay is intended as an aid in the diagnosis of influenza from Nasopharyngeal swab specimens and should not be used as a sole basis for treatment. Nasal washings and aspirates are unacceptable for Xpert Xpress SARS-CoV-2/FLU/RSV testing.  Fact Sheet for Patients: EntrepreneurPulse.com.au  Fact Sheet for  Healthcare Providers: IncredibleEmployment.be  This test is not yet approved or cleared by the [place] FDA and has been authorized for detection and/or diagnosis of SARS-CoV-2 by FDA under an Emergency Use Authorization (EUA). This EUA will remain in effect (meaning this test can be used) for the duration of the COVID-19 declaration under Section 564(b)(1) of the Act, 21 U.S.C. section 360bbb-3(b)(1), unless the authorization is terminated or revoked.  Performed at [facility], [address] [place], [place] [address]          Radiology Studies: DG Chest 2 View  Result Date: 03/08/2021 CLINICAL DATA:  Shortness of breath and head congestion. EXAM: CHEST - 2 VIEW  COMPARISON:  February 09, 2021 FINDINGS: There is no evidence of acute infiltrate, pleural effusion or pneumothorax. Stable elevation of the right hemidiaphragm is seen. The heart size and mediastinal contours are within normal limits. Degenerative changes seen throughout the thoracic spine. IMPRESSION: Stable exam without active cardiopulmonary disease. Electronically Signed   By: Virgina Norfolk M.D.   On: 03/08/2021 18:51        Scheduled Meds:  alfuzosin  10 mg Oral Q breakfast   aspirin EC  81 mg Oral Daily   enoxaparin (LOVENOX) injection  60 mg Subcutaneous QHS   finasteride  5 mg Oral Daily   fluticasone  1 spray Each Nare BID   furosemide  40 mg Intravenous Q12H   losartan  50 mg Oral BID   [START ON 03/10/2021] pravastatin  40 mg Oral Once per day on Mon Thu   Continuous Infusions:   LOS: 1 day   The patient is critically ill with multiple organ systems failure and requires high complexity decision making for assessment and support, frequent evaluation and titration of therapies, application of advanced monitoring technologies and extensive interpretation of multiple databases. Critical Care Time devoted to patient care services described in this note  Time spent: 40  minutes     Sheldon Sem, Geraldo Docker, MD [facility]   If 7PM-7AM, please contact night-coverage 03/09/2021, 3:31 PM

## 2021-03-10 DIAGNOSIS — I422 Other hypertrophic cardiomyopathy: Secondary | ICD-10-CM

## 2021-03-10 LAB — COMPREHENSIVE METABOLIC PANEL
ALT: 17 U/L (ref 0–44)
AST: 18 U/L (ref 15–41)
Albumin: 3.7 g/dL (ref 3.5–5.0)
Alkaline Phosphatase: 61 U/L (ref 38–126)
Anion gap: 8 (ref 5–15)
BUN: 22 mg/dL (ref 8–23)
CO2: 27 mmol/L (ref 22–32)
Calcium: 8.7 mg/dL — ABNORMAL LOW (ref 8.9–10.3)
Chloride: 103 mmol/L (ref 98–111)
Creatinine, Ser: 1.04 mg/dL (ref 0.61–1.24)
GFR, Estimated: >60 mL/min (ref >60)
Glucose, Bld: 126 mg/dL — ABNORMAL HIGH (ref 70–99)
Potassium: 3.4 mmol/L — ABNORMAL LOW (ref 3.5–5.1)
Sodium: 138 mmol/L (ref 135–145)
Total Bilirubin: 0.7 mg/dL (ref 0.3–1.2)
Total Protein: 6.4 g/dL — ABNORMAL LOW (ref 6.5–8.1)

## 2021-03-10 LAB — CBC WITH DIFFERENTIAL/PLATELET
Abs Immature Granulocytes: 0.02 10*3/uL (ref 0.00–0.07)
Basophils Absolute: 0 10*3/uL (ref 0.0–0.1)
Basophils Relative: 0 %
Eosinophils Absolute: 0.1 10*3/uL (ref 0.0–0.5)
Eosinophils Relative: 1 %
HCT: 40.8 % (ref 39.0–52.0)
Hemoglobin: 13.9 g/dL (ref 13.0–17.0)
Immature Granulocytes: 0 %
Lymphocytes Relative: 19 %
Lymphs Abs: 1.1 10*3/uL (ref 0.7–4.0)
MCH: 30.9 pg (ref 26.0–34.0)
MCHC: 34.1 g/dL (ref 30.0–36.0)
MCV: 90.7 fL (ref 80.0–100.0)
Monocytes Absolute: 0.6 10*3/uL (ref 0.1–1.0)
Monocytes Relative: 9 %
Neutro Abs: 4.3 10*3/uL (ref 1.7–7.7)
Neutrophils Relative %: 71 %
Platelets: 220 10*3/uL (ref 150–400)
RBC: 4.5 MIL/uL (ref 4.22–5.81)
RDW: 12.5 % (ref 11.5–15.5)
WBC: 6.1 10*3/uL (ref 4.0–10.5)
nRBC: 0 % (ref 0.0–0.2)

## 2021-03-10 LAB — POTASSIUM: Potassium: 4.2 mmol/L (ref 3.5–5.1)

## 2021-03-10 LAB — MAGNESIUM
Magnesium: 2 mg/dL (ref 1.7–2.4)
Magnesium: 2.1 mg/dL (ref 1.7–2.4)

## 2021-03-10 LAB — PHOSPHORUS: Phosphorus: 3.5 mg/dL (ref 2.5–4.6)

## 2021-03-10 MED ORDER — LIVING BETTER WITH HEART FAILURE BOOK: Freq: Once | Status: AC

## 2021-03-10 MED ORDER — LOSARTAN POTASSIUM 50 MG PO TABS
25.0000 mg | ORAL_TABLET | Freq: Two times a day (BID) | ORAL | Status: DC
  Administered 2021-03-10 – 2021-03-12 (×4): 25 mg via ORAL
  Filled 2021-03-10 (×4): qty 1

## 2021-03-10 MED ORDER — POTASSIUM CHLORIDE CRYS ER 20 MEQ PO TBCR
40.0000 meq | EXTENDED_RELEASE_TABLET | Freq: Two times a day (BID) | ORAL | Status: DC
  Administered 2021-03-10 – 2021-03-11 (×4): 40 meq via ORAL
  Filled 2021-03-10 (×5): qty 2

## 2021-03-10 NOTE — Progress Notes (Signed)
PROGRESS NOTE    Adam Banks  [id number] DOB: Apr 26, 1935 DOA: 03/08/2021 PCP: Celene Squibb, MD   Brief Narrative:  Adam Banks is an 86 y.o. WM PMHx  hypertension, hyperlipidemia, BPH   Presents to the emergency department due to shortness of breath.  Patient complained of about 13-monthhistory of nasal stuffiness/facial congestion with cough.  He was treated with Flonase without improvement, patient states that he was unable to breathe in enough air on attempt to take a deep breath.  Symptoms are usually intermittent.  On attempt to go to his mailbox at home, shortness of breath worsened significantly.  Symptoms worsened today with sensation of  difficulty in being able to take a deep breath.  He endorsed no changes in his chronic leg swelling and denies fever, chills, cough, nausea, vomiting or abdominal pain.   ED Course:  In the emergency department, he was bradycardic, BP was 167/69, other vital signs were within normal range.  Work-up in the ED showed normal CBC and BMP, BNP was 186, troponin x1 was 19, influenza A, B, SARS coronavirus 2 was negative. Chest x-ray showed no active cardiopulmonary disease.   IV Lasix 40 mg x 1 was given, aspirin 324 mg p.o. x1 was given.  Hospitalist was asked to admit patient for further evaluation and management.   Subjective: 9/1 afebrile overnight, A/O x4, negative SOB negative CP    Assessment & Plan:  Covid vaccination;  Principal Problem:   CHF (congestive heart failure) (HCC) Active Problems:   Elevated troponin   Essential hypertension   Mixed hyperlipidemia   BPH (benign prostatic hyperplasia)   Elevated brain natriuretic peptide (BNP) level   Hypertrophic cardiomyopathy/Acute diastolic CHF -Echocardiogram consistent with hypertrophic cardiomyopathy.  See results below -Furosemide IV 40 mg BID - 9/1 decrease Losartan 25 mg BID -Strict in and out - Daily weight -TED hose 12 hours on during the day, 12 hours off at  night -9/1 consult to cardiology PA BBernerd Phoinformed me that patient does have a cardiologist Dr. PDorris Carnesbut has not seen a cardiologist in several years.  Will arrange for follow-up at discharge    Elevated troponin/Demand ischemia Troponin x2-19 > 18 Continue telemetry EKG personally reviewed showed sinus bradycardia at a rate of 55 beats minute. Aspirin 325 mg x 1 was given in the ED, continue aspirin 81 mg p.o. daily.   Essential hypertension (uncontrolled), Continue losartan -9/1 BP controlled - See CHF  Hypokalemia - Potassium goal> 4 - 9/1 K-Dur 40 mEq BID -9/1 recheck K/Mg'@1600'$   Mixed HLD -Pravastatin 40 mg daily  BPH -Alfuzosin 10 mg daily -Finasteride 5 mg daily   Obese (BMI 35.79 kg/m)   DVT prophylaxis: Lovenox Code Status: Full Family Communication: 9/1 wife at bedside for discussion of plan of care all questions addressed Status is: Inpatient    Dispo: The patient is from: Home              Anticipated d/c is to: Home              Anticipated d/c date is: 2 days              Patient currently is not medically stable to d/c.      Consultants:  Cardiology PA BBernerd Pho    Procedures/Significant Events:  8/31 Echocardiogram  Left Ventricle: Left ventricular ejection fraction, by estimation, is  >75%. The left ventricle has hyperdynamic function. The left ventricle has  no regional wall  motion abnormalities. The left ventricular internal  cavity size was normal in size. There  is moderate asymmetric left ventricular hypertrophy of the septal segment.  Left ventricular diastolic parameters are consistent with Grade I  diastolic dysfunction (impaired relaxation).   Right Ventricle: The right ventricular size is normal. No increase in  right ventricular wall thickness. Right ventricular systolic function is  normal. Tricuspid regurgitation signal is inadequate for assessing PA  pressure.   Left Atrium: Left atrial size was  normal in size.   Right Atrium: Right atrial size was normal in size.   Pericardium: Trivial pericardial effusion is present. The pericardial  effusion is anterior to the right ventricle. Presence of pericardial fat  pad.   Mitral Valve: The mitral valve is grossly normal. Mild mitral annular  calcification. Trivial mitral valve regurgitation. MV peak gradient, 4.0  mmHg. The mean mitral valve gradient is 1.0 mmHg.   Tricuspid Valve: The tricuspid valve is grossly normal. Tricuspid valve  regurgitation is trivial.   Aortic Valve: The aortic valve has an indeterminant number of cusps. There  is moderate calcification of the aortic valve. Aortic valve regurgitation  is not visualized. Mild aortic stenosis is present. Aortic valve mean  gradient measures 6.0 mmHg. Aortic  valve peak gradient measures 11.0 mmHg. Aortic valve area, by VTI measures  1.82 cm.   Pulmonic Valve: The pulmonic valve was grossly normal. Pulmonic valve  regurgitation is trivial.   Aorta: The aortic root is normal in size and structure.   I have personally reviewed and interpreted all radiology studies and my findings are as above.  VENTILATOR SETTINGS:    Cultures   Antimicrobials:    Devices    LINES / TUBES:      Continuous Infusions:   Objective: Vitals:   03/09/21 2032 03/09/21 2144 03/10/21 0500 03/10/21 0637  BP: (!) 105/53 (!) 137/54  132/62  Pulse: 61 (!) 56  69  Resp: 20   19  Temp: 98 F (36.7 C)   98.6 F (37 C)  TempSrc: Oral   Oral  SpO2: 98% 92%    Weight:   115.2 kg   Height:        Intake/Output Summary (Last 24 hours) at 03/10/2021 1107 Last data filed at 03/10/2021 0900 Gross per 24 hour  Intake 1830 ml  Output 1350 ml  Net 480 ml    Filed Weights   03/08/21 2136 03/09/21 0700 03/10/21 0500  Weight: 120.6 kg 119.7 kg 115.2 kg    Examination:  General: No acute respiratory distress Eyes: negative scleral hemorrhage, negative anisocoria, negative  icterus ENT: Negative Runny nose, negative gingival bleeding, extremely hard of hearing Neck:  Negative scars, masses, torticollis, lymphadenopathy, JVD Lungs: Clear to auscultation bilaterally without wheezes or crackles Cardiovascular: Regular rate and rhythm without murmur gallop or rub normal S1 and S2 Abdomen: negative abdominal pain, nondistended, positive soft, bowel sounds, no rebound, no ascites, no appreciable mass Extremities: +2 - 3+ edema bilateral lower extremity to the knees (per wife usually much worse) Skin: Negative rashes, lesions, ulcers Psychiatric:  Negative depression, negative anxiety, negative fatigue, negative mania  Central nervous system:  Cranial nerves II through XII intact, tongue/uvula midline, all extremities muscle strength 5/5, sensation intact throughout,  negative dysarthria, negative expressive aphasia, negative receptive aphasia.  .     Data Reviewed: Care during the described time interval was provided by me .  I have reviewed this patient's available data, including medical history, events of note, physical  examination, and all test results as part of my evaluation.   CBC: Recent Labs  Lab 03/08/21 1737 03/09/21 0543 03/10/21 0522  WBC 7.5 6.8 6.1  NEUTROABS 5.8  --  4.3  HGB 13.8 13.9 13.9  HCT 40.1 40.7 40.8  MCV 91.1 90.8 90.7  PLT 244 232 [id number]    Basic Metabolic Panel: Recent Labs  Lab 03/08/21 1737 03/09/21 0543 03/10/21 0522  NA 139 142 138  K 3.9 3.7 3.4*  CL 109 106 103  CO2 '23 26 27  '$ GLUCOSE 105* 119* 126*  BUN '19 18 22  '$ CREATININE 1.06 1.00 1.04  CALCIUM 8.7* 9.0 8.7*  MG  --  1.9 2.0  PHOS  --  3.4 3.5    GFR: Estimated Creatinine Clearance: 69.3 mL/min (by C-G formula based on SCr of 1.04 mg/dL). Liver Function Tests: Recent Labs  Lab 03/08/21 1737 03/09/21 0543 03/10/21 0522  AST '19 21 18  '$ ALT '17 17 17  '$ ALKPHOS 64 61 61  BILITOT 0.5 0.7 0.7  PROT 6.6 6.6 6.4*  ALBUMIN 3.9 3.7 3.7    No results for  input(s): LIPASE, AMYLASE in the last 168 hours. No results for input(s): AMMONIA in the last 168 hours. Coagulation Profile: Recent Labs  Lab 03/09/21 0543  INR 1.0    Cardiac Enzymes: No results for input(s): CKTOTAL, CKMB, CKMBINDEX, TROPONINI in the last 168 hours. BNP (last 3 results) No results for input(s): PROBNP in the last 8760 hours. HbA1C: No results for input(s): HGBA1C in the last 72 hours. CBG: No results for input(s): GLUCAP in the last 168 hours. Lipid Profile: No results for input(s): CHOL, HDL, LDLCALC, TRIG, CHOLHDL, LDLDIRECT in the last 72 hours. Thyroid Function Tests: No results for input(s): TSH, T4TOTAL, FREET4, T3FREE, THYROIDAB in the last 72 hours. Anemia Panel: No results for input(s): VITAMINB12, FOLATE, FERRITIN, TIBC, IRON, RETICCTPCT in the last 72 hours. Urine analysis:    Component Value Date/Time   COLORURINE YELLOW 01/24/2017 2125   APPEARANCEUR Clear 05/04/2020 0947   LABSPEC 1.013 01/24/2017 2125   PHURINE 6.0 01/24/2017 2125   GLUCOSEU Negative 05/04/2020 0947   HGBUR SMALL (A) 01/24/2017 2125   BILIRUBINUR Negative 05/04/2020 [place] 01/24/2017 2125   PROTEINUR Negative 05/04/2020 0947   PROTEINUR NEGATIVE 01/24/2017 2125   UROBILINOGEN negative (A) 01/13/2020 0936   NITRITE Negative 05/04/2020 0947   NITRITE NEGATIVE 01/24/2017 2125   LEUKOCYTESUR Negative 05/04/2020 0947   Sepsis Labs: '@LABRCNTIP'$ (procalcitonin:4,lacticidven:4)  ) Recent Results (from the past 240 hour(s))  Resp Panel by RT-PCR (Flu A&B, Covid) Nasopharyngeal Swab     Status: None   Collection Time: 03/08/21  5:40 PM   Specimen: Nasopharyngeal Swab; Nasopharyngeal(NP) swabs in vial transport medium  Result Value Ref Range Status   SARS Coronavirus 2 by RT PCR NEGATIVE NEGATIVE Final    Comment: (NOTE) SARS-CoV-2 target nucleic acids are NOT DETECTED.  The SARS-CoV-2 RNA is generally detectable in upper respiratory specimens during the  acute phase of infection. The lowest concentration of SARS-CoV-2 viral copies this assay can detect is 138 copies/mL. A negative result does not preclude SARS-Cov-2 infection and should not be used as the sole basis for treatment or other patient management decisions. A negative result may occur with  improper specimen collection/handling, submission of specimen other than nasopharyngeal swab, presence of viral mutation(s) within the areas targeted by this assay, and inadequate number of viral copies(<138 copies/mL). A negative result must be combined with clinical observations, patient  history, and epidemiological information. The expected result is Negative.  Fact Sheet for Patients:  EntrepreneurPulse.com.au  Fact Sheet for Healthcare Providers:  IncredibleEmployment.be  This test is no t yet approved or cleared by the [place] FDA and  has been authorized for detection and/or diagnosis of SARS-CoV-2 by FDA under an Emergency Use Authorization (EUA). This EUA will remain  in effect (meaning this test can be used) for the duration of the COVID-19 declaration under Section 564(b)(1) of the Act, 21 U.S.C.section 360bbb-3(b)(1), unless the authorization is terminated  or revoked sooner.       Influenza A by PCR NEGATIVE NEGATIVE Final   Influenza B by PCR NEGATIVE NEGATIVE Final    Comment: (NOTE) The Xpert Xpress SARS-CoV-2/FLU/RSV plus assay is intended as an aid in the diagnosis of influenza from Nasopharyngeal swab specimens and should not be used as a sole basis for treatment. Nasal washings and aspirates are unacceptable for Xpert Xpress SARS-CoV-2/FLU/RSV testing.  Fact Sheet for Patients: EntrepreneurPulse.com.au  Fact Sheet for Healthcare Providers: IncredibleEmployment.be  This test is not yet approved or cleared by the [place] FDA and has been authorized for detection and/or  diagnosis of SARS-CoV-2 by FDA under an Emergency Use Authorization (EUA). This EUA will remain in effect (meaning this test can be used) for the duration of the COVID-19 declaration under Section 564(b)(1) of the Act, 21 U.S.C. section 360bbb-3(b)(1), unless the authorization is terminated or revoked.  Performed at [facility], [address] [place], [place] [address]          Radiology Studies: DG Chest 2 View  Result Date: 03/08/2021 CLINICAL DATA:  Shortness of breath and head congestion. EXAM: CHEST - 2 VIEW COMPARISON:  February 09, 2021 FINDINGS: There is no evidence of acute infiltrate, pleural effusion or pneumothorax. Stable elevation of the right hemidiaphragm is seen. The heart size and mediastinal contours are within normal limits. Degenerative changes seen throughout the thoracic spine. IMPRESSION: Stable exam without active cardiopulmonary disease. Electronically Signed   By: Virgina Norfolk M.D.   On: 03/08/2021 18:51   ECHOCARDIOGRAM COMPLETE  Result Date: 03/09/2021    ECHOCARDIOGRAM REPORT   Patient Name:   Johnson A Calder Date of Exam: 03/09/2021 Medical Rec #:  [id number]        Height:       72.0 in Accession #:    [id number]       Weight:       263.9 lb Date of Birth:  1935-06-16         BSA:          2.397 m Patient Age:    89 years         BP:           166/62 mmHg Patient Gender: M                HR:           57 bpm. Exam Location:  [facility] Procedure: 2D Echo, Cardiac Doppler and Color Doppler Indications:    CHF  History:        Patient has prior history of Echocardiogram examinations, most                 recent 03/12/2019. CHF, Arrythmias:LBBB; Risk Factors:Hypertension                 and Dyslipidemia.  Sonographer:    Wenda Low Referring Phys: [id number] [place]  1. Left ventricular  ejection fraction, by estimation, is >75%. The left ventricle has hyperdynamic function. The left ventricle has no regional wall motion abnormalities. There is  moderate asymmetric left ventricular hypertrophy of the septal segment. Left ventricular diastolic parameters are consistent with Grade I diastolic dysfunction (impaired relaxation).  2. Right ventricular systolic function is normal. The right ventricular size is normal. Tricuspid regurgitation signal is inadequate for assessing PA pressure.  3. The pericardial effusion is anterior to the right ventricle.  4. The mitral valve is grossly normal. Trivial mitral valve regurgitation.  5. The aortic valve has an indeterminant number of cusps. There is moderate calcification of the aortic valve. Aortic valve regurgitation is not visualized. Mild aortic valve stenosis. Aortic valve area, by VTI measures 1.82 cm. Aortic valve mean gradient measures 6.0 mmHg.  6. The inferior vena cava is normal in size with greater than 50% respiratory variability, suggesting right atrial pressure of 3 mmHg. Comparison(s): Prior images reviewed side by side. Vigorous LVEF. Aortic valve is moderately calcified, cannot exclude bicuspid valve on review of current and prior images. Mild aortic stenosis. FINDINGS  Left Ventricle: Left ventricular ejection fraction, by estimation, is >75%. The left ventricle has hyperdynamic function. The left ventricle has no regional wall motion abnormalities. The left ventricular internal cavity size was normal in size. There is moderate asymmetric left ventricular hypertrophy of the septal segment. Left ventricular diastolic parameters are consistent with Grade I diastolic dysfunction (impaired relaxation). Right Ventricle: The right ventricular size is normal. No increase in right ventricular wall thickness. Right ventricular systolic function is normal. Tricuspid regurgitation signal is inadequate for assessing PA pressure. Left Atrium: Left atrial size was normal in size. Right Atrium: Right atrial size was normal in size. Pericardium: Trivial pericardial effusion is present. The pericardial effusion is  anterior to the right ventricle. Presence of pericardial fat pad. Mitral Valve: The mitral valve is grossly normal. Mild mitral annular calcification. Trivial mitral valve regurgitation. MV peak gradient, 4.0 mmHg. The mean mitral valve gradient is 1.0 mmHg. Tricuspid Valve: The tricuspid valve is grossly normal. Tricuspid valve regurgitation is trivial. Aortic Valve: The aortic valve has an indeterminant number of cusps. There is moderate calcification of the aortic valve. Aortic valve regurgitation is not visualized. Mild aortic stenosis is present. Aortic valve mean gradient measures 6.0 mmHg. Aortic valve peak gradient measures 11.0 mmHg. Aortic valve area, by VTI measures 1.82 cm. Pulmonic Valve: The pulmonic valve was grossly normal. Pulmonic valve regurgitation is trivial. Aorta: The aortic root is normal in size and structure. Venous: The inferior vena cava is normal in size with greater than 50% respiratory variability, suggesting right atrial pressure of 3 mmHg. IAS/Shunts: No atrial level shunt detected by color flow Doppler.  LEFT VENTRICLE PLAX 2D LVIDd:         4.53 cm  Diastology LVIDs:         2.98 cm  LV e' medial:    6.75 cm/s LV PW:         1.20 cm  LV E/e' medial:  6.6 LV IVS:        1.41 cm  LV e' lateral:   7.18 cm/s LVOT diam:     2.10 cm  LV E/e' lateral: 6.2 LV SV:         70 LV SV Index:   29 LVOT Area:     3.46 cm  RIGHT VENTRICLE RV S prime:     18.00 cm/s LEFT ATRIUM  Index       RIGHT ATRIUM           Index LA diam:        4.40 cm 1.84 cm/m  RA Area:     13.20 cm LA Vol (A2C):   56.3 ml 23.49 ml/m RA Volume:   30.60 ml  12.77 ml/m LA Vol (A4C):   61.3 ml 25.57 ml/m LA Biplane Vol: 59.3 ml 24.74 ml/m  AORTIC VALVE AV Area (Vmax):    1.74 cm AV Area (Vmean):   1.84 cm AV Area (VTI):     1.82 cm AV Vmax:           166.00 cm/s AV Vmean:          118.000 cm/s AV VTI:            0.386 m AV Peak Grad:      11.0 mmHg AV Mean Grad:      6.0 mmHg LVOT Vmax:         83.60  cm/s LVOT Vmean:        62.800 cm/s LVOT VTI:          0.203 m LVOT/AV VTI ratio: 0.53 MITRAL VALVE MV Area (PHT): 2.49 cm    SHUNTS MV Area VTI:   2.78 cm    Systemic VTI:  0.20 m MV Peak grad:  4.0 mmHg    Systemic Diam: 2.10 cm MV Mean grad:  1.0 mmHg MV Vmax:       1.00 m/s MV Vmean:      39.5 cm/s MV Decel Time: 305 msec MV E velocity: 44.70 cm/s MV A velocity: 77.40 cm/s MV E/A ratio:  0.58 Rozann Lesches MD Electronically signed by Rozann Lesches MD Signature Date/Time: 03/09/2021/4:08:27 PM    Final         Scheduled Meds:  alfuzosin  10 mg Oral Q breakfast   aspirin EC  81 mg Oral Daily   enoxaparin (LOVENOX) injection  60 mg Subcutaneous QHS   finasteride  5 mg Oral Daily   fluticasone  1 spray Each Nare BID   furosemide  40 mg Intravenous Q12H   Living Better with Heart Failure Book   Does not apply Once   losartan  50 mg Oral BID   potassium chloride  40 mEq Oral BID   pravastatin  40 mg Oral Once per day on Mon Thu   Continuous Infusions:   LOS: 2 days   The patient is critically ill with multiple organ systems failure and requires high complexity decision making for assessment and support, frequent evaluation and titration of therapies, application of advanced monitoring technologies and extensive interpretation of multiple databases. Critical Care Time devoted to patient care services described in this note  Time spent: 40 minutes     Twyla Dais, Geraldo Docker, MD [facility]   If 7PM-7AM, please contact night-coverage 03/10/2021, 11:07 AM

## 2021-03-10 NOTE — TOC Progression Note (Signed)
Transition of Care [facility]) - Progression Note    Patient Details  Name: Adam Banks MRN: [id number] Date of Birth: 24-Mar-1935  [facility] CM/SW Contact  Boneta Lucks, RN Phone Number: 03/10/2021, 10:34 AM  Clinical Narrative:   Patient needing education on CHF, Echo today, Living Better book ordered, RN to deliver.    Barriers to Discharge: Continued Medical Work up   Readmission Risk Interventions Readmission Risk Prevention Plan 03/10/2021  Medication Screening Complete  Transportation Screening Complete  Some recent data might be hidden

## 2021-03-11 LAB — CBC WITH DIFFERENTIAL/PLATELET
Abs Immature Granulocytes: 0.02 10*3/uL (ref 0.00–0.07)
Basophils Absolute: 0 10*3/uL (ref 0.0–0.1)
Basophils Relative: 0 %
Eosinophils Absolute: 0.2 10*3/uL (ref 0.0–0.5)
Eosinophils Relative: 3 %
HCT: 40.5 % (ref 39.0–52.0)
Hemoglobin: 13.7 g/dL (ref 13.0–17.0)
Immature Granulocytes: 0 %
Lymphocytes Relative: 22 %
Lymphs Abs: 1.4 10*3/uL (ref 0.7–4.0)
MCH: 30.6 pg (ref 26.0–34.0)
MCHC: 33.8 g/dL (ref 30.0–36.0)
MCV: 90.6 fL (ref 80.0–100.0)
Monocytes Absolute: 0.5 10*3/uL (ref 0.1–1.0)
Monocytes Relative: 9 %
Neutro Abs: 4 10*3/uL (ref 1.7–7.7)
Neutrophils Relative %: 66 %
Platelets: 222 10*3/uL (ref 150–400)
RBC: 4.47 MIL/uL (ref 4.22–5.81)
RDW: 12.2 % (ref 11.5–15.5)
WBC: 6.1 10*3/uL (ref 4.0–10.5)
nRBC: 0 % (ref 0.0–0.2)

## 2021-03-11 LAB — COMPREHENSIVE METABOLIC PANEL
ALT: 23 U/L (ref 0–44)
AST: 25 U/L (ref 15–41)
Albumin: 3.6 g/dL (ref 3.5–5.0)
Alkaline Phosphatase: 56 U/L (ref 38–126)
Anion gap: 12 (ref 5–15)
BUN: 25 mg/dL — ABNORMAL HIGH (ref 8–23)
CO2: 23 mmol/L (ref 22–32)
Calcium: 8.5 mg/dL — ABNORMAL LOW (ref 8.9–10.3)
Chloride: 103 mmol/L (ref 98–111)
Creatinine, Ser: 1.09 mg/dL (ref 0.61–1.24)
GFR, Estimated: >60 mL/min (ref >60)
Glucose, Bld: 121 mg/dL — ABNORMAL HIGH (ref 70–99)
Potassium: 4.2 mmol/L (ref 3.5–5.1)
Sodium: 138 mmol/L (ref 135–145)
Total Bilirubin: 0.8 mg/dL (ref 0.3–1.2)
Total Protein: 6.1 g/dL — ABNORMAL LOW (ref 6.5–8.1)

## 2021-03-11 LAB — MAGNESIUM: Magnesium: 2 mg/dL (ref 1.7–2.4)

## 2021-03-11 LAB — PHOSPHORUS: Phosphorus: 3.6 mg/dL (ref 2.5–4.6)

## 2021-03-11 MED ORDER — FUROSEMIDE 10 MG/ML IJ SOLN
80.0000 mg | Freq: Two times a day (BID) | INTRAMUSCULAR | Status: DC
  Administered 2021-03-11: 80 mg via INTRAVENOUS
  Filled 2021-03-11 (×2): qty 8

## 2021-03-11 MED ORDER — ALUM & MAG HYDROXIDE-SIMETH 200-200-20 MG/5ML PO SUSP
30.0000 mL | ORAL | Status: DC | PRN
  Administered 2021-03-11: 30 mL via ORAL
  Filled 2021-03-11: qty 30

## 2021-03-11 MED ORDER — FUROSEMIDE 10 MG/ML IJ SOLN: 60.0000 mg | Freq: Two times a day (BID) | INTRAMUSCULAR | Status: DC

## 2021-03-11 NOTE — Progress Notes (Signed)
PROGRESS NOTE    Adam Banks  [id number] DOB: 1935-02-05 DOA: 03/08/2021 PCP: Celene Squibb, MD   Brief Narrative:  Adam Banks is an 86 y.o. WM PMHx  hypertension, hyperlipidemia, BPH   Presents to the emergency department due to shortness of breath.  Patient complained of about 64-monthhistory of nasal stuffiness/facial congestion with cough.  He was treated with Flonase without improvement, patient states that he was unable to breathe in enough air on attempt to take a deep breath.  Symptoms are usually intermittent.  On attempt to go to his mailbox at home, shortness of breath worsened significantly.  Symptoms worsened today with sensation of  difficulty in being able to take a deep breath.  He endorsed no changes in his chronic leg swelling and denies fever, chills, cough, nausea, vomiting or abdominal pain.   ED Course:  In the emergency department, he was bradycardic, BP was 167/69, other vital signs were within normal range.  Work-up in the ED showed normal CBC and BMP, BNP was 186, troponin x1 was 19, influenza A, B, SARS coronavirus 2 was negative. Chest x-ray showed no active cardiopulmonary disease.   IV Lasix 40 mg x 1 was given, aspirin 324 mg p.o. x1 was given.  Hospitalist was asked to admit patient for further evaluation and management.   Subjective: 9/2 afebrile overnight A/O x4, patient states ambulated up and down hallway without DOE.  Sitting in chair appears significantly improved.     Assessment & Plan:  Covid vaccination;  Principal Problem:   CHF (congestive heart failure) (HCC) Active Problems:   Elevated troponin   Essential hypertension   Mixed hyperlipidemia   BPH (benign prostatic hyperplasia)   Elevated brain natriuretic peptide (BNP) level   Hypertrophic cardiomyopathy/Acute diastolic CHF -Echocardiogram consistent with hypertrophic cardiomyopathy.  See results below - 9/2 increase Furosemide IV 80 mg BID -Fluid restrict 12053mday -  9/1 decrease Losartan 25 mg BID -Strict in and out -2.0 L  - Daily weight Filed Weights   03/09/21 0700 03/10/21 0500 03/11/21 0537  Weight: 119.7 kg 115.2 kg 116.4 kg  -TED hose 12 hours on during the day, 12 hours off at night -9/1 consult to cardiology PA BrBernerd Phonformed me that patient does have a cardiologist Dr. PaDorris Carnesut has not seen a cardiologist in several years.  Will arrange for follow-up at discharge    Elevated troponin/Demand ischemia Troponin x2-19 > 18 Continue telemetry EKG personally reviewed showed sinus bradycardia at a rate of 55 beats minute. Aspirin 325 mg x 1 was given in the ED, continue aspirin 81 mg p.o. daily.   Essential hypertension (uncontrolled), Continue losartan -9/1 BP controlled - See CHF  Hypokalemia - Potassium goal> 4  Mixed HLD -Pravastatin 40 mg daily  BPH -Alfuzosin 10 mg daily -Finasteride 5 mg daily   Obese (BMI 35.79 kg/m)   DVT prophylaxis: Lovenox Code Status: Full Family Communication: 9/1 wife at bedside for discussion of plan of care all questions addressed Status is: Inpatient    Dispo: The patient is from: Home              Anticipated d/c is to: Home              Anticipated d/c date is: 2 days              Patient currently is not medically stable to d/c.      Consultants:  Cardiology PA BrBernerd Pho  Procedures/Significant Events:  8/31 Echocardiogram  Left Ventricle: Left ventricular ejection fraction, by estimation, is  >75%. The left ventricle has hyperdynamic function. The left ventricle has  no regional wall motion abnormalities. The left ventricular internal  cavity size was normal in size. There  is moderate asymmetric left ventricular hypertrophy of the septal segment.  Left ventricular diastolic parameters are consistent with Grade I  diastolic dysfunction (impaired relaxation).      I have personally reviewed and interpreted all radiology studies and my findings  are as above.  VENTILATOR SETTINGS:    Cultures   Antimicrobials:    Devices    LINES / TUBES:      Continuous Infusions:   Objective: Vitals:   03/11/21 0537 03/11/21 0745 03/11/21 1135 03/11/21 1548  BP: (!) 130/51 (!) 124/48 (!) 109/57 (!) 121/45  Pulse: 64 65 75 70  Resp: (!) 22 18 (!) 22 20  Temp: 97.7 F (36.5 C) 97.8 F (36.6 C) 98.6 F (37 C)   TempSrc: Oral Oral Oral   SpO2: 94% 98% 97% 96%  Weight: 116.4 kg     Height: 6' (1.829 m)       Intake/Output Summary (Last 24 hours) at 03/11/2021 1752 Last data filed at 03/11/2021 1300 Gross per 24 hour  Intake 600 ml  Output 1700 ml  Net -1100 ml    Filed Weights   03/09/21 0700 03/10/21 0500 03/11/21 0537  Weight: 119.7 kg 115.2 kg 116.4 kg    Examination:  General: No acute respiratory distress Eyes: negative scleral hemorrhage, negative anisocoria, negative icterus ENT: Negative Runny nose, negative gingival bleeding, extremely hard of hearing Neck:  Negative scars, masses, torticollis, lymphadenopathy, JVD Lungs: Clear to auscultation bilaterally without wheezes or crackles Cardiovascular: Regular rate and rhythm without murmur gallop or rub normal S1 and S2 Abdomen: negative abdominal pain, nondistended, positive soft, bowel sounds, no rebound, no ascites, no appreciable mass Extremities: +2 edema bilateral lower extremity to the knees  Skin: Negative rashes, lesions, ulcers Psychiatric:  Negative depression, negative anxiety, negative fatigue, negative mania  Central nervous system:  Cranial nerves II through XII intact, tongue/uvula midline, all extremities muscle strength 5/5, sensation intact throughout,  negative dysarthria, negative expressive aphasia, negative receptive aphasia.  .     Data Reviewed: Care during the described time interval was provided by me .  I have reviewed this patient's available data, including medical history, events of note, physical examination, and all test  results as part of my evaluation.   CBC: Recent Labs  Lab 03/08/21 1737 03/09/21 0543 03/10/21 0522 03/11/21 0513  WBC 7.5 6.8 6.1 6.1  NEUTROABS 5.8  --  4.3 4.0  HGB 13.8 13.9 13.9 13.7  HCT 40.1 40.7 40.8 40.5  MCV 91.1 90.8 90.7 90.6  PLT 244 232 220 [id number]    Basic Metabolic Panel: Recent Labs  Lab 03/08/21 1737 03/09/21 0543 03/10/21 0522 03/10/21 1606 03/11/21 0513  NA 139 142 138  --  138  K 3.9 3.7 3.4* 4.2 4.2  CL 109 106 103  --  103  CO2 '23 26 27  '$ --  23  GLUCOSE 105* 119* 126*  --  121*  BUN '19 18 22  '$ --  25*  CREATININE 1.06 1.00 1.04  --  1.09  CALCIUM 8.7* 9.0 8.7*  --  8.5*  MG  --  1.9 2.0 2.1 2.0  PHOS  --  3.4 3.5  --  3.6    GFR: Estimated Creatinine Clearance: 66.4  mL/min (by C-G formula based on SCr of 1.09 mg/dL). Liver Function Tests: Recent Labs  Lab 03/08/21 1737 03/09/21 0543 03/10/21 0522 03/11/21 0513  AST '19 21 18 25  '$ ALT '17 17 17 23  '$ ALKPHOS 64 61 61 56  BILITOT 0.5 0.7 0.7 0.8  PROT 6.6 6.6 6.4* 6.1*  ALBUMIN 3.9 3.7 3.7 3.6    No results for input(s): LIPASE, AMYLASE in the last 168 hours. No results for input(s): AMMONIA in the last 168 hours. Coagulation Profile: Recent Labs  Lab 03/09/21 0543  INR 1.0    Cardiac Enzymes: No results for input(s): CKTOTAL, CKMB, CKMBINDEX, TROPONINI in the last 168 hours. BNP (last 3 results) No results for input(s): PROBNP in the last 8760 hours. HbA1C: No results for input(s): HGBA1C in the last 72 hours. CBG: No results for input(s): GLUCAP in the last 168 hours. Lipid Profile: No results for input(s): CHOL, HDL, LDLCALC, TRIG, CHOLHDL, LDLDIRECT in the last 72 hours. Thyroid Function Tests: No results for input(s): TSH, T4TOTAL, FREET4, T3FREE, THYROIDAB in the last 72 hours. Anemia Panel: No results for input(s): VITAMINB12, FOLATE, FERRITIN, TIBC, IRON, RETICCTPCT in the last 72 hours. Urine analysis:    Component Value Date/Time   COLORURINE YELLOW 01/24/2017 2125    APPEARANCEUR Clear 05/04/2020 0947   LABSPEC 1.013 01/24/2017 2125   PHURINE 6.0 01/24/2017 2125   GLUCOSEU Negative 05/04/2020 0947   HGBUR SMALL (A) 01/24/2017 2125   BILIRUBINUR Negative 05/04/2020 [place] 01/24/2017 2125   PROTEINUR Negative 05/04/2020 0947   PROTEINUR NEGATIVE 01/24/2017 2125   UROBILINOGEN negative (A) 01/13/2020 0936   NITRITE Negative 05/04/2020 0947   NITRITE NEGATIVE 01/24/2017 2125   LEUKOCYTESUR Negative 05/04/2020 0947   Sepsis Labs: '@LABRCNTIP'$ (procalcitonin:4,lacticidven:4)  ) Recent Results (from the past 240 hour(s))  Resp Panel by RT-PCR (Flu A&B, Covid) Nasopharyngeal Swab     Status: None   Collection Time: 03/08/21  5:40 PM   Specimen: Nasopharyngeal Swab; Nasopharyngeal(NP) swabs in vial transport medium  Result Value Ref Range Status   SARS Coronavirus 2 by RT PCR NEGATIVE NEGATIVE Final    Comment: (NOTE) SARS-CoV-2 target nucleic acids are NOT DETECTED.  The SARS-CoV-2 RNA is generally detectable in upper respiratory specimens during the acute phase of infection. The lowest concentration of SARS-CoV-2 viral copies this assay can detect is 138 copies/mL. A negative result does not preclude SARS-Cov-2 infection and should not be used as the sole basis for treatment or other patient management decisions. A negative result may occur with  improper specimen collection/handling, submission of specimen other than nasopharyngeal swab, presence of viral mutation(s) within the areas targeted by this assay, and inadequate number of viral copies(<138 copies/mL). A negative result must be combined with clinical observations, patient history, and epidemiological information. The expected result is Negative.  Fact Sheet for Patients:  EntrepreneurPulse.com.au  Fact Sheet for Healthcare Providers:  IncredibleEmployment.be  This test is no t yet approved or cleared by the [place] FDA  and  has been authorized for detection and/or diagnosis of SARS-CoV-2 by FDA under an Emergency Use Authorization (EUA). This EUA will remain  in effect (meaning this test can be used) for the duration of the COVID-19 declaration under Section 564(b)(1) of the Act, 21 U.S.C.section 360bbb-3(b)(1), unless the authorization is terminated  or revoked sooner.       Influenza A by PCR NEGATIVE NEGATIVE Final   Influenza B by PCR NEGATIVE NEGATIVE Final    Comment: (NOTE) The Xpert  Xpress SARS-CoV-2/FLU/RSV plus assay is intended as an aid in the diagnosis of influenza from Nasopharyngeal swab specimens and should not be used as a sole basis for treatment. Nasal washings and aspirates are unacceptable for Xpert Xpress SARS-CoV-2/FLU/RSV testing.  Fact Sheet for Patients: EntrepreneurPulse.com.au  Fact Sheet for Healthcare Providers: IncredibleEmployment.be  This test is not yet approved or cleared by the [place] FDA and has been authorized for detection and/or diagnosis of SARS-CoV-2 by FDA under an Emergency Use Authorization (EUA). This EUA will remain in effect (meaning this test can be used) for the duration of the COVID-19 declaration under Section 564(b)(1) of the Act, 21 U.S.C. section 360bbb-3(b)(1), unless the authorization is terminated or revoked.  Performed at [facility], [address], [place], [place] [address]          Radiology Studies: No results found.      Scheduled Meds:  alfuzosin  10 mg Oral Q breakfast   aspirin EC  81 mg Oral Daily   enoxaparin (LOVENOX) injection  60 mg Subcutaneous QHS   finasteride  5 mg Oral Daily   fluticasone  1 spray Each Nare BID   furosemide  40 mg Intravenous Q12H   losartan  25 mg Oral BID   potassium chloride  40 mEq Oral BID   pravastatin  40 mg Oral Once per day on Mon Thu   Continuous Infusions:   LOS: 3 days   The patient is critically ill with multiple organ  systems failure and requires high complexity decision making for assessment and support, frequent evaluation and titration of therapies, application of advanced monitoring technologies and extensive interpretation of multiple databases. Critical Care Time devoted to patient care services described in this note  Time spent: 40 minutes     Naphtali Riede, Geraldo Docker, MD [facility]   If 7PM-7AM, please contact night-coverage 03/11/2021, 5:52 PM

## 2021-03-11 NOTE — Care Management Important Message (Signed)
Important Message  Patient Details  Name: Adam Banks MRN: [id number] Date of Birth: [birth year]   Medicare Important Message Given:  Yes     Tommy Medal 03/11/2021, 1:55 PM

## 2021-03-12 DIAGNOSIS — I248 Other forms of acute ischemic heart disease: Secondary | ICD-10-CM

## 2021-03-12 LAB — PHOSPHORUS: Phosphorus: 3.5 mg/dL (ref 2.5–4.6)

## 2021-03-12 LAB — CBC WITH DIFFERENTIAL/PLATELET
Abs Immature Granulocytes: 0.02 10*3/uL (ref 0.00–0.07)
Basophils Absolute: 0 10*3/uL (ref 0.0–0.1)
Basophils Relative: 1 %
Eosinophils Absolute: 0.1 10*3/uL (ref 0.0–0.5)
Eosinophils Relative: 3 %
HCT: 39.1 % (ref 39.0–52.0)
Hemoglobin: 13.4 g/dL (ref 13.0–17.0)
Immature Granulocytes: 0 %
Lymphocytes Relative: 20 %
Lymphs Abs: 1.1 10*3/uL (ref 0.7–4.0)
MCH: 31.7 pg (ref 26.0–34.0)
MCHC: 34.3 g/dL (ref 30.0–36.0)
MCV: 92.4 fL (ref 80.0–100.0)
Monocytes Absolute: 0.5 10*3/uL (ref 0.1–1.0)
Monocytes Relative: 10 %
Neutro Abs: 3.7 10*3/uL (ref 1.7–7.7)
Neutrophils Relative %: 66 %
Platelets: 227 10*3/uL (ref 150–400)
RBC: 4.23 MIL/uL (ref 4.22–5.81)
RDW: 12.4 % (ref 11.5–15.5)
WBC: 5.6 10*3/uL (ref 4.0–10.5)
nRBC: 0 % (ref 0.0–0.2)

## 2021-03-12 LAB — COMPREHENSIVE METABOLIC PANEL
ALT: 29 U/L (ref 0–44)
AST: 29 U/L (ref 15–41)
Albumin: 3.7 g/dL (ref 3.5–5.0)
Alkaline Phosphatase: 56 U/L (ref 38–126)
Anion gap: 10 (ref 5–15)
BUN: 33 mg/dL — ABNORMAL HIGH (ref 8–23)
CO2: 25 mmol/L (ref 22–32)
Calcium: 8.9 mg/dL (ref 8.9–10.3)
Chloride: 103 mmol/L (ref 98–111)
Creatinine, Ser: 1.12 mg/dL (ref 0.61–1.24)
GFR, Estimated: >60 mL/min (ref >60)
Glucose, Bld: 120 mg/dL — ABNORMAL HIGH (ref 70–99)
Potassium: 4.2 mmol/L (ref 3.5–5.1)
Sodium: 138 mmol/L (ref 135–145)
Total Bilirubin: 0.6 mg/dL (ref 0.3–1.2)
Total Protein: 6.1 g/dL — ABNORMAL LOW (ref 6.5–8.1)

## 2021-03-12 LAB — MAGNESIUM: Magnesium: 2.4 mg/dL (ref 1.7–2.4)

## 2021-03-12 MED ORDER — ASPIRIN 81 MG PO TBEC: 81.0000 mg | DELAYED_RELEASE_TABLET | Freq: Every day | ORAL | 0 refills | Status: AC

## 2021-03-12 MED ORDER — FLUTICASONE PROPIONATE 50 MCG/ACT NA SUSP: 1.0000 | Freq: Two times a day (BID) | NASAL | 0 refills | Status: AC

## 2021-03-12 MED ORDER — FUROSEMIDE 40 MG PO TABS
40.0000 mg | ORAL_TABLET | Freq: Every day | ORAL | Status: DC
  Administered 2021-03-12: 40 mg via ORAL
  Filled 2021-03-12: qty 1

## 2021-03-12 MED ORDER — SALINE SPRAY 0.65 % NA SOLN: 1.0000 | NASAL | 0 refills | Status: AC | PRN

## 2021-03-12 MED ORDER — POTASSIUM CHLORIDE CRYS ER 20 MEQ PO TBCR
20.0000 meq | EXTENDED_RELEASE_TABLET | Freq: Every day | ORAL | 0 refills | Status: DC
Start: 2021-03-12 — End: 2021-04-08

## 2021-03-12 MED ORDER — POTASSIUM CHLORIDE CRYS ER 20 MEQ PO TBCR
20.0000 meq | EXTENDED_RELEASE_TABLET | Freq: Every day | ORAL | Status: DC
  Administered 2021-03-12: 20 meq via ORAL

## 2021-03-12 MED ORDER — FUROSEMIDE 40 MG PO TABS: 40.0000 mg | ORAL_TABLET | Freq: Every day | ORAL | 0 refills | Status: DC

## 2021-03-12 MED ORDER — LOSARTAN POTASSIUM 25 MG PO TABS: 25.0000 mg | ORAL_TABLET | Freq: Two times a day (BID) | ORAL | 0 refills | Status: DC

## 2021-03-12 NOTE — Plan of Care (Signed)
  Problem: Education: Goal: Knowledge of General Education information will improve Description: Including pain rating scale, medication(s)/side effects and non-pharmacologic comfort measures 03/12/2021 0911 by Santa Lighter, RN Outcome: Adequate for Discharge 03/12/2021 0911 by Santa Lighter, RN Outcome: Progressing   Problem: Clinical Measurements: Goal: Ability to maintain clinical measurements within normal limits will improve Outcome: Adequate for Discharge Goal: Will remain free from infection Outcome: Adequate for Discharge Goal: Diagnostic test results will improve Outcome: Adequate for Discharge Goal: Respiratory complications will improve Outcome: Adequate for Discharge Goal: Cardiovascular complication will be avoided Outcome: Adequate for Discharge   Problem: Activity: Goal: Risk for activity intolerance will decrease Outcome: Adequate for Discharge   Problem: Nutrition: Goal: Adequate nutrition will be maintained Outcome: Adequate for Discharge   Problem: Coping: Goal: Level of anxiety will decrease Outcome: Adequate for Discharge   Problem: Elimination: Goal: Will not experience complications related to bowel motility Outcome: Adequate for Discharge Goal: Will not experience complications related to urinary retention Outcome: Adequate for Discharge   Problem: Pain Managment: Goal: General experience of comfort will improve Outcome: Adequate for Discharge   Problem: Safety: Goal: Ability to remain free from injury will improve Outcome: Adequate for Discharge   Problem: Skin Integrity: Goal: Risk for impaired skin integrity will decrease Outcome: Adequate for Discharge   Problem: Education: Goal: Ability to demonstrate management of disease process will improve Outcome: Adequate for Discharge Goal: Ability to verbalize understanding of medication therapies will improve Outcome: Adequate for Discharge Goal: Individualized Educational Video(s) Outcome:  Adequate for Discharge

## 2021-03-12 NOTE — Progress Notes (Signed)
Nsg Discharge Note  Admit Date:  03/08/2021 Discharge date: 03/12/2021   Lanae Boast A Kaffenberger to be D/C'd Home per MD order.  AVS completed.  Copy for chart, and copy for patient signed, and dated. Patient/caregiver able to verbalize understanding. Removed IV-CDI. Reviewed d/c paperwork with patient and wife. Answered all questions. Walked stable patient and wife to Media planner. Discharge Medication: Allergies as of 03/12/2021       Reactions   Flomax [tamsulosin Hcl] Other (See Comments)   Patient states that he passed out.        Medication List     STOP taking these medications    fenofibrate 160 MG tablet   rosuvastatin 40 MG tablet Commonly known as: CRESTOR       TAKE these medications    alfuzosin 10 MG 24 hr tablet Commonly known as: UROXATRAL Take 1 tablet (10 mg total) by mouth daily with breakfast. What changed:  when to take this Another medication with the same name was removed. Continue taking this medication, and follow the directions you see here.   aspirin 81 MG EC tablet Take 1 tablet (81 mg total) by mouth daily. Swallow whole.   finasteride 5 MG tablet Commonly known as: PROSCAR Take 5 mg by mouth daily.   fluticasone 50 MCG/ACT nasal spray Commonly known as: FLONASE Place 1 spray into both nostrils 2 (two) times daily.   furosemide 40 MG tablet Commonly known as: LASIX Take 1 tablet (40 mg total) by mouth daily.   losartan 25 MG tablet Commonly known as: COZAAR Take 1 tablet (25 mg total) by mouth 2 (two) times daily. What changed:  medication strength how much to take   naproxen sodium 220 MG tablet Commonly known as: ALEVE Take 220 mg by mouth daily as needed (pain).   potassium chloride SA 20 MEQ tablet Commonly known as: KLOR-CON Take 1 tablet (20 mEq total) by mouth daily.   pravastatin 40 MG tablet Commonly known as: PRAVACHOL Take 40 mg by mouth daily. Mondays and Thursday only   sodium chloride 0.65 % Soln nasal  spray Commonly known as: OCEAN Place 1 spray into both nostrils as needed for congestion.   Symbicort 80-4.5 MCG/ACT inhaler Generic drug: budesonide-formoterol Inhale 1 puff into the lungs 2 (two) times daily.        Discharge Assessment: Vitals:   03/11/21 2248 03/12/21 0603  BP: (!) 115/48 (!) 123/51  Pulse: 66 63  Resp: 18   Temp: 98.2 F (36.8 C) 98.4 F (36.9 C)  SpO2: 97% 94%   Skin clean, dry and intact without evidence of skin break down, no evidence of skin tears noted. IV catheter discontinued intact. Site without signs and symptoms of complications - no redness or edema noted at insertion site, patient denies c/o pain - only slight tenderness at site.  Dressing with slight pressure applied.  D/c Instructions-Education: Discharge instructions given to patient/family with verbalized understanding. D/c education completed with patient/family including follow up instructions, medication list, d/c activities limitations if indicated, with other d/c instructions as indicated by MD - patient able to verbalize understanding, all questions fully answered. Patient instructed to return to ED, call 911, or call MD for any changes in condition.  Patient escorted via [place], and D/C home via private auto.  Santa Lighter, RN 03/12/2021 10:39 AM

## 2021-03-12 NOTE — Discharge Summary (Signed)
Physician Discharge Summary  Adam Banks [id number] DOB: 17-Oct-1934 DOA: 03/08/2021  PCP: Celene Squibb, MD  Admit date: 03/08/2021 Discharge date: 03/12/2021  Time spent: 35 minutes  Recommendations for Outpatient Follow-up:   Hypertrophic cardiomyopathy/Acute diastolic CHF -Echocardiogram consistent with hypertrophic cardiomyopathy.  See results below - Lasix PO 40 mg daily: Cardiology to make adjustments at follow-up -Fluid restrict 1256m/day - Losartan 25 mg BID -Strict in and out -3.2 L - Daily weight Filed Weights   03/10/21 0500 03/11/21 0537 03/12/21 0500  Weight: 115.2 kg 116.4 kg 117 kg  -Weigh yourself daily 117 kg (258 pounds) this is your base weight -TED hose 12 hours on during the day, 12 hours off at night -9/1 consult to cardiology PA BBernerd Pho -Follow-up with PA [facility] 04/13/2021 @ 1530   Elevated troponin/Demand ischemia -Troponin x2-19 > 18 consistent with demand ischemia -continue aspirin 81 mg p.o. daily.    Essential hypertension (uncontrolled)  - See CHF  Hypokalemia - Potassium goal> 4 -Potassium PO 20 mg daily: Cardiology to make adjustments at follow-up  Mixed HLD -Pravastatin 40 mg daily  BPH -Alfuzosin 10 mg daily -Finasteride 5 mg daily     Obese (BMI 35.79 kg/m) -Address with PCP     Discharge Diagnoses:  Principal Problem:   CHF (congestive heart failure) (HCC) Active Problems:   Elevated troponin   Essential hypertension   Mixed hyperlipidemia   BPH (benign prostatic hyperplasia)   Elevated brain natriuretic peptide (BNP) level   Discharge Condition: Stable  Diet recommendation: Heart healthy  Filed Weights   03/10/21 0500 03/11/21 0537 03/12/21 0500  Weight: 115.2 kg 116.4 kg 117 kg    History of present illness:  Adam YENSERis an 86y.o. WM PMHx  hypertension, hyperlipidemia, BPH    Presents to the emergency department due to shortness of breath.  Patient complained of  about 470-monthistory of nasal stuffiness/facial congestion with cough.  He was treated with Flonase without improvement, patient states that he was unable to breathe in enough air on attempt to take a deep breath.  Symptoms are usually intermittent.  On attempt to go to his mailbox at home, shortness of breath worsened significantly.  Symptoms worsened today with sensation of  difficulty in being able to take a deep breath.  He endorsed no changes in his chronic leg swelling and denies fever, chills, cough, nausea, vomiting or abdominal pain.   ED Course:  In the emergency department, he was bradycardic, BP was 167/69, other vital signs were within normal range.  Work-up in the ED showed normal CBC and BMP, BNP was 186, troponin x1 was 19, influenza A, B, SARS coronavirus 2 was negative. Chest x-ray showed no active cardiopulmonary disease.   IV Lasix 40 mg x 1 was given, aspirin 324 mg p.o. x1 was given.  Hospitalist was asked to admit patient for further evaluation and management.  Hospital Course:  See above  Procedures:  8/31 Echocardiogram  Left Ventricle: Left ventricular ejection fraction, by estimation, is  >75%. The left ventricle has hyperdynamic function. The left ventricle has  no regional wall motion abnormalities. The left ventricular internal  cavity size was normal in size. There  is moderate asymmetric left ventricular hypertrophy of the septal segment.  Left ventricular diastolic parameters are consistent with Grade I  diastolic dysfunction (impaired relaxation).     Consultations: Cardiology PA BrBernerd Pho  Discharge Exam: Vitals:   03/11/21 1548 03/11/21 2248 03/12/21 0500  03/12/21 0603  BP: (!) 121/45 (!) 115/48  (!) 123/51  Pulse: 70 66  63  Resp: 20 18    Temp:  98.2 F (36.8 C)  98.4 F (36.9 C)  TempSrc:      SpO2: 96% 97%  94%  Weight:   117 kg   Height:        General: No acute respiratory distress Eyes: negative scleral hemorrhage,  negative anisocoria, negative icterus ENT: Negative Runny nose, negative gingival bleeding, extremely hard of hearing Neck:  Negative scars, masses, torticollis, lymphadenopathy, JVD Lungs: Clear to auscultation bilaterally without wheezes or crackles Cardiovascular: Regular rate and rhythm without murmur gallop or rub normal S1 and S2  Discharge Instructions   Allergies as of 03/12/2021       Reactions   Flomax [tamsulosin Hcl] Other (See Comments)   Patient states that he passed out.        Medication List     STOP taking these medications    fenofibrate 160 MG tablet   rosuvastatin 40 MG tablet Commonly known as: CRESTOR       TAKE these medications    alfuzosin 10 MG 24 hr tablet Commonly known as: UROXATRAL Take 1 tablet (10 mg total) by mouth daily with breakfast. What changed:  when to take this Another medication with the same name was removed. Continue taking this medication, and follow the directions you see here.   aspirin 81 MG EC tablet Take 1 tablet (81 mg total) by mouth daily. Swallow whole.   finasteride 5 MG tablet Commonly known as: PROSCAR Take 5 mg by mouth daily.   fluticasone 50 MCG/ACT nasal spray Commonly known as: FLONASE Place 1 spray into both nostrils 2 (two) times daily.   furosemide 40 MG tablet Commonly known as: LASIX Take 1 tablet (40 mg total) by mouth daily.   losartan 25 MG tablet Commonly known as: COZAAR Take 1 tablet (25 mg total) by mouth 2 (two) times daily. What changed:  medication strength how much to take   naproxen sodium 220 MG tablet Commonly known as: ALEVE Take 220 mg by mouth daily as needed (pain).   potassium chloride SA 20 MEQ tablet Commonly known as: KLOR-CON Take 1 tablet (20 mEq total) by mouth daily.   pravastatin 40 MG tablet Commonly known as: PRAVACHOL Take 40 mg by mouth daily. Mondays and Thursday only   sodium chloride 0.65 % Soln nasal spray Commonly known as: OCEAN Place 1  spray into both nostrils as needed for congestion.   Symbicort 80-4.5 MCG/ACT inhaler Generic drug: budesonide-formoterol Inhale 1 puff into the lungs 2 (two) times daily.       Allergies  Allergen Reactions   Flomax [Tamsulosin Hcl] Other (See Comments)    Patient states that he passed out.    Follow-up Information     Erma Heritage, PA-C Follow up on 04/13/2021.   Specialties: Physician Assistant, Cardiology Why: Cardiology Hospital Follow-up on 04/13/2021 at 3:30 PM. Contact information: [address] [place] [address] [phone]                  The results of significant diagnostics from this hospitalization (including imaging, microbiology, ancillary and laboratory) are listed below for reference.    Significant Diagnostic Studies: DG Chest 2 View  Result Date: 03/08/2021 CLINICAL DATA:  Shortness of breath and head congestion. EXAM: CHEST - 2 VIEW COMPARISON:  February 09, 2021 FINDINGS: There is no evidence of acute infiltrate, pleural  effusion or pneumothorax. Stable elevation of the right hemidiaphragm is seen. The heart size and mediastinal contours are within normal limits. Degenerative changes seen throughout the thoracic spine. IMPRESSION: Stable exam without active cardiopulmonary disease. Electronically Signed   By: Virgina Norfolk M.D.   On: 03/08/2021 18:51   ECHOCARDIOGRAM COMPLETE  Result Date: 03/09/2021    ECHOCARDIOGRAM REPORT   Patient Name:   Adam Banks Date of Exam: 03/09/2021 Medical Rec #:  [id number]        Height:       72.0 in Accession #:    [id number]       Weight:       263.9 lb Date of Birth:  [birth year]         BSA:          2.397 m Patient Age:    7 years         BP:           166/62 mmHg Patient Gender: M                HR:           57 bpm. Exam Location:  [facility] Procedure: 2D Echo, Cardiac Doppler and Color Doppler Indications:    CHF  History:        Patient has prior history of Echocardiogram examinations, most                  recent 03/12/2019. CHF, Arrythmias:LBBB; Risk Factors:Hypertension                 and Dyslipidemia.  Sonographer:    Wenda Low Referring Phys: [id number] [place]  1. Left ventricular ejection fraction, by estimation, is >75%. The left ventricle has hyperdynamic function. The left ventricle has no regional wall motion abnormalities. There is moderate asymmetric left ventricular hypertrophy of the septal segment. Left ventricular diastolic parameters are consistent with Grade I diastolic dysfunction (impaired relaxation).  2. Right ventricular systolic function is normal. The right ventricular size is normal. Tricuspid regurgitation signal is inadequate for assessing PA pressure.  3. The pericardial effusion is anterior to the right ventricle.  4. The mitral valve is grossly normal. Trivial mitral valve regurgitation.  5. The aortic valve has an indeterminant number of cusps. There is moderate calcification of the aortic valve. Aortic valve regurgitation is not visualized. Mild aortic valve stenosis. Aortic valve area, by VTI measures 1.82 cm. Aortic valve mean gradient measures 6.0 mmHg.  6. The inferior vena cava is normal in size with greater than 50% respiratory variability, suggesting right atrial pressure of 3 mmHg. Comparison(s): Prior images reviewed side by side. Vigorous LVEF. Aortic valve is moderately calcified, cannot exclude bicuspid valve on review of current and prior images. Mild aortic stenosis. FINDINGS  Left Ventricle: Left ventricular ejection fraction, by estimation, is >75%. The left ventricle has hyperdynamic function. The left ventricle has no regional wall motion abnormalities. The left ventricular internal cavity size was normal in size. There is moderate asymmetric left ventricular hypertrophy of the septal segment. Left ventricular diastolic parameters are consistent with Grade I diastolic dysfunction (impaired relaxation). Right Ventricle: The right  ventricular size is normal. No increase in right ventricular wall thickness. Right ventricular systolic function is normal. Tricuspid regurgitation signal is inadequate for assessing PA pressure. Left Atrium: Left atrial size was normal in size. Right Atrium: Right atrial size was normal in size. Pericardium: Trivial pericardial effusion is present. The pericardial effusion  is anterior to the right ventricle. Presence of pericardial fat pad. Mitral Valve: The mitral valve is grossly normal. Mild mitral annular calcification. Trivial mitral valve regurgitation. MV peak gradient, 4.0 mmHg. The mean mitral valve gradient is 1.0 mmHg. Tricuspid Valve: The tricuspid valve is grossly normal. Tricuspid valve regurgitation is trivial. Aortic Valve: The aortic valve has an indeterminant number of cusps. There is moderate calcification of the aortic valve. Aortic valve regurgitation is not visualized. Mild aortic stenosis is present. Aortic valve mean gradient measures 6.0 mmHg. Aortic valve peak gradient measures 11.0 mmHg. Aortic valve area, by VTI measures 1.82 cm. Pulmonic Valve: The pulmonic valve was grossly normal. Pulmonic valve regurgitation is trivial. Aorta: The aortic root is normal in size and structure. Venous: The inferior vena cava is normal in size with greater than 50% respiratory variability, suggesting right atrial pressure of 3 mmHg. IAS/Shunts: No atrial level shunt detected by color flow Doppler.  LEFT VENTRICLE PLAX 2D LVIDd:         4.53 cm  Diastology LVIDs:         2.98 cm  LV e' medial:    6.75 cm/s LV PW:         1.20 cm  LV E/e' medial:  6.6 LV IVS:        1.41 cm  LV e' lateral:   7.18 cm/s LVOT diam:     2.10 cm  LV E/e' lateral: 6.2 LV SV:         70 LV SV Index:   29 LVOT Area:     3.46 cm  RIGHT VENTRICLE RV S prime:     18.00 cm/s LEFT ATRIUM             Index       RIGHT ATRIUM           Index LA diam:        4.40 cm 1.84 cm/m  RA Area:     13.20 cm LA Vol (A2C):   56.3 ml 23.49 ml/m  RA Volume:   30.60 ml  12.77 ml/m LA Vol (A4C):   61.3 ml 25.57 ml/m LA Biplane Vol: 59.3 ml 24.74 ml/m  AORTIC VALVE AV Area (Vmax):    1.74 cm AV Area (Vmean):   1.84 cm AV Area (VTI):     1.82 cm AV Vmax:           166.00 cm/s AV Vmean:          118.000 cm/s AV VTI:            0.386 m AV Peak Grad:      11.0 mmHg AV Mean Grad:      6.0 mmHg LVOT Vmax:         83.60 cm/s LVOT Vmean:        62.800 cm/s LVOT VTI:          0.203 m LVOT/AV VTI ratio: 0.53 MITRAL VALVE MV Area (PHT): 2.49 cm    SHUNTS MV Area VTI:   2.78 cm    Systemic VTI:  0.20 m MV Peak grad:  4.0 mmHg    Systemic Diam: 2.10 cm MV Mean grad:  1.0 mmHg MV Vmax:       1.00 m/s MV Vmean:      39.5 cm/s MV Decel Time: 305 msec MV E velocity: 44.70 cm/s MV A velocity: 77.40 cm/s MV E/A ratio:  0.58 Rozann Lesches MD Electronically signed by Rozann Lesches MD Signature Date/Time:  03/09/2021/4:08:27 PM    Final     Microbiology: Recent Results (from the past 240 hour(s))  Resp Panel by RT-PCR (Flu A&B, Covid) Nasopharyngeal Swab     Status: None   Collection Time: 03/08/21  5:40 PM   Specimen: Nasopharyngeal Swab; Nasopharyngeal(NP) swabs in vial transport medium  Result Value Ref Range Status   SARS Coronavirus 2 by RT PCR NEGATIVE NEGATIVE Final    Comment: (NOTE) SARS-CoV-2 target nucleic acids are NOT DETECTED.  The SARS-CoV-2 RNA is generally detectable in upper respiratory specimens during the acute phase of infection. The lowest concentration of SARS-CoV-2 viral copies this assay can detect is 138 copies/mL. A negative result does not preclude SARS-Cov-2 infection and should not be used as the sole basis for treatment or other patient management decisions. A negative result may occur with  improper specimen collection/handling, submission of specimen other than nasopharyngeal swab, presence of viral mutation(s) within the areas targeted by this assay, and inadequate number of viral copies(<138 copies/mL). A negative  result must be combined with clinical observations, patient history, and epidemiological information. The expected result is Negative.  Fact Sheet for Patients:  EntrepreneurPulse.com.au  Fact Sheet for Healthcare Providers:  IncredibleEmployment.be  This test is no t yet approved or cleared by the [place] FDA and  has been authorized for detection and/or diagnosis of SARS-CoV-2 by FDA under an Emergency Use Authorization (EUA). This EUA will remain  in effect (meaning this test can be used) for the duration of the COVID-19 declaration under Section 564(b)(1) of the Act, 21 U.S.C.section 360bbb-3(b)(1), unless the authorization is terminated  or revoked sooner.       Influenza A by PCR NEGATIVE NEGATIVE Final   Influenza B by PCR NEGATIVE NEGATIVE Final    Comment: (NOTE) The Xpert Xpress SARS-CoV-2/FLU/RSV plus assay is intended as an aid in the diagnosis of influenza from Nasopharyngeal swab specimens and should not be used as a sole basis for treatment. Nasal washings and aspirates are unacceptable for Xpert Xpress SARS-CoV-2/FLU/RSV testing.  Fact Sheet for Patients: EntrepreneurPulse.com.au  Fact Sheet for Healthcare Providers: IncredibleEmployment.be  This test is not yet approved or cleared by the [place] FDA and has been authorized for detection and/or diagnosis of SARS-CoV-2 by FDA under an Emergency Use Authorization (EUA). This EUA will remain in effect (meaning this test can be used) for the duration of the COVID-19 declaration under Section 564(b)(1) of the Act, 21 U.S.C. section 360bbb-3(b)(1), unless the authorization is terminated or revoked.  Performed at [facility], [address], [place], [place] [address]      Labs: Basic Metabolic Panel: Recent Labs  Lab 03/08/21 1737 03/09/21 0543 03/10/21 0522 03/10/21 1606 03/11/21 0513 03/12/21 0404  NA 139 142 138  --   138 138  K 3.9 3.7 3.4* 4.2 4.2 4.2  CL 109 106 103  --  103 103  CO2 '23 26 27  '$ --  23 25  GLUCOSE 105* 119* 126*  --  121* 120*  BUN '19 18 22  '$ --  25* 33*  CREATININE 1.06 1.00 1.04  --  1.09 1.12  CALCIUM 8.7* 9.0 8.7*  --  8.5* 8.9  MG  --  1.9 2.0 2.1 2.0 2.4  PHOS  --  3.4 3.5  --  3.6 3.5   Liver Function Tests: Recent Labs  Lab 03/08/21 1737 03/09/21 0543 03/10/21 0522 03/11/21 0513 03/12/21 0404  AST '19 21 18 25 29  '$ ALT '17 17 17 23 '$ 29  ALKPHOS 64 61 61 56 56  BILITOT 0.5 0.7 0.7 0.8 0.6  PROT 6.6 6.6 6.4* 6.1* 6.1*  ALBUMIN 3.9 3.7 3.7 3.6 3.7   No results for input(s): LIPASE, AMYLASE in the last 168 hours. No results for input(s): AMMONIA in the last 168 hours. CBC: Recent Labs  Lab 03/08/21 1737 03/09/21 0543 03/10/21 0522 03/11/21 0513 03/12/21 0404  WBC 7.5 6.8 6.1 6.1 5.6  NEUTROABS 5.8  --  4.3 4.0 3.7  HGB 13.8 13.9 13.9 13.7 13.4  HCT 40.1 40.7 40.8 40.5 39.1  MCV 91.1 90.8 90.7 90.6 92.4  PLT 244 232 220 222 227   Cardiac Enzymes: No results for input(s): CKTOTAL, CKMB, CKMBINDEX, TROPONINI in the last 168 hours. BNP: BNP (last 3 results) Recent Labs    03/08/21 1737  BNP 186.0*    ProBNP (last 3 results) No results for input(s): PROBNP in the last 8760 hours.  CBG: No results for input(s): GLUCAP in the last 168 hours.     Signed:  Dia Crawford, MD [facility]

## 2021-03-12 NOTE — Plan of Care (Signed)
  Problem: Education: Goal: Knowledge of General Education information will improve Description Including pain rating scale, medication(s)/side effects and non-pharmacologic comfort measures Outcome: Progressing   Problem: Health Behavior/Discharge Planning: Goal: Ability to manage health-related needs will improve Outcome: Progressing   

## 2021-03-24 NOTE — Progress Notes (Signed)
Cardiology Office Note   Date:  03/27/2021   ID:  Adam Banks, DOB 11/10/1934, MRN [id number]  PCP:  Celene Squibb, MD  Cardiologist:   Dorris Carnes, MD   Pt referred by Dr Alyson Ingles for chst discomfort      History of Present Illness: Adam Banks is a 86 y.o. male with a history of syncope   I saw him back in 2013   The spell sounds like it may have been due to dehydration with orthostasis   PT on flomax at the time    Some bradycardia noted at [facility] (where he went)   EKG showed SB in office here   I was not convincied significant   That was only cardiology visit  I saw the pt back in 2020 for chest discomfort   Also complained of SOB with exertion (200 feet to mailbox  SOB   Pt also had some dizziness with standing  Echo was done   LVEF was normal   No further testing done     The pt was admitted to [facility] in early September (03/12/21)   4 month on congestion   Rx flonase   Intermitt     In ED HR slow  BP 167/69   Trop neg   Pt given IV lasix x 1  Echo done ( results above)  Sent home on Lasix     The pt comes in today with his wife    He says that he still gets SOB with walking to mailbox   Wife says mailbox is several hundred feet away    She thinks he is doing pretty good    He denies CP     No signficiant dizziness or syncope   No CP        Current Meds  Medication Sig   alfuzosin (UROXATRAL) 10 MG 24 hr tablet Take 1 tablet (10 mg total) by mouth daily with breakfast. (Patient taking differently: Take 10 mg by mouth every evening.)   aspirin EC 81 MG EC tablet Take 1 tablet (81 mg total) by mouth daily. Swallow whole.   finasteride (PROSCAR) 5 MG tablet Take 5 mg by mouth daily.   fluticasone (FLONASE) 50 MCG/ACT nasal spray Place 1 spray into both nostrils 2 (two) times daily.   furosemide (LASIX) 40 MG tablet Take 1 tablet (40 mg total) by mouth daily.   losartan (COZAAR) 25 MG tablet Take 1 tablet (25 mg total) by mouth 2 (two) times daily.   potassium chloride  SA (KLOR-CON) 20 MEQ tablet Take 1 tablet (20 mEq total) by mouth daily.   pravastatin (PRAVACHOL) 40 MG tablet Take 40 mg by mouth daily. Mondays and Thursday only   sodium chloride (OCEAN) 0.65 % SOLN nasal spray Place 1 spray into both nostrils as needed for congestion.   SYMBICORT 80-4.5 MCG/ACT inhaler Inhale 1 puff into the lungs 2 (two) times daily.     Allergies:   Flomax [tamsulosin hcl]   Past Medical History:  Diagnosis Date   Enlarged prostate 2014   History of urinary hesitancy    Hypertension     Past Surgical History:  Procedure Laterality Date   HYDROCELE EXCISION Left 05/05/2014   Procedure: LEFT HYDROCELECTOMY ADULT;  Surgeon: Jorja Loa, MD;  Location: [facility];  Service: Urology;  Laterality: Left;   INGUINAL HERNIA REPAIR Right 1977   TONSILLECTOMY       Social History:  The patient  reports  that he has never smoked. He has never used smokeless tobacco. He reports that he does not drink alcohol and does not use drugs.   Family History:  [name] family history includes Heart attack in his father; Hodgkin's lymphoma in his mother.    ROS:  Please see the history of present illness. All other systems are reviewed and  Negative to the above problem except as noted.    PHYSICAL EXAM: VS:  BP 138/64   Pulse 68   Ht 6' (1.829 m)   Wt 262 lb 6.4 oz (119 kg)   SpO2 97%   BMI 35.59 kg/m   GEN: Morbidly obese 86 yo in no acute distress  HEENT: normal  Neck: no JVD, carotid bruits, or masses Cardiac: RRR; Gr II/VI systolic murmur base   No  rubs,    Triv edema  Respiratory:  clear to auscultation bilaterally, normal work of breathing GI: soft, nontender, nondistended, + BS  No hepatomegaly  MS: no deformity Moving all extremities   Skin: warm and dry, no rash Neuro:  Strength and sensation are intact Psych: euthymic mood, full affect   EKG:  EKG isnot ordered    Echo   Aug 2022  Left ventricular ejection fraction, by estimation, is >75%.  The left ventricle has hyperdynamic function. The left ventricle has no regional wall motion abnormalities. There is moderate asymmetric left ventricular hypertrophy of the septal segment. Left ventricular diastolic parameters are consistent with Grade I diastolic dysfunction (impaired relaxation). 1. Right ventricular systolic function is normal. The right ventricular size is normal. Tricuspid regurgitation signal is inadequate for assessing PA pressure. 2. 3. The pericardial effusion is anterior to the right ventricle. 4. The mitral valve is grossly normal. Trivial mitral valve regurgitation. The aortic valve has an indeterminant number of cusps. There is moderate calcification of the aortic valve. Aortic valve regurgitation is not visualized. Mild aortic valve stenosis. Aortic valve area, by VTI measures 1.82 cm. Aortic valve mean gradient measures 6.0 mmHg. 5. The inferior vena cava is normal in size with greater than 50% respiratory variability, suggesting right atrial pressure of 3 mmHg. 6. Comparison(s): Prior images reviewed side by side. Vigorous LVEF. Aortic valve is moderately calcified, cannot exclude bicuspid valve on review of current and prior images. Mild aortic stenosis.  Echo   Sept 2020 1. The left ventricle has normal systolic function with an ejection fraction of 60-65%. The cavity size was normal. There is mild concentric left ventricular hypertrophy. Left ventricular diastolic Doppler parameters are consistent with impaired relaxation. No evidence of left ventricular regional wall motion abnormalities. 2. The right ventricle has normal systolic function. The cavity was normal. There is no increase in right ventricular wall thickness. 3. The aortic valve is tricuspid. Moderate thickening of the aortic valve. Mild calcification of the aortic valve. Borderline stenosis of the aortic valve. Moderate aortic annular calcification noted. 4. The mitral valve is  grossly normal. 5. The aorta is normal unless otherwise noted. Lipid Panel    Component Value Date/Time   CHOL 132 05/05/2019 0916   TRIG 96 05/05/2019 0916   HDL 36 (L) 05/05/2019 0916   CHOLHDL 3.7 05/05/2019 0916   VLDL 19 05/05/2019 0916   LDLCALC 77 05/05/2019 0916      Wt Readings from Last 3 Encounters:  03/25/21 262 lb 6.4 oz (119 kg)  03/12/21 258 lb (117 kg)  05/04/20 265 lb (120.2 kg)      ASSESSMENT AND PLAN:  1  Dyspnea.  Pt with recent admit for SOB   Rx with lasix   May be doing some better   NO CP   On exam today volume is not bad.   Recomm;   Keep on same meds  I would set up for PFTs    Consider coronary CT angiogram    2  AV dz   Very mild restricted motion  Follow   I do not think causing symptoms     3  CV dz   Set up for cartoid USN  4 HL  Patient is on pravastatin   Last LDL 156 (12/2020)    He has not tolerated higher dose or other statins (achiness)   He has atherosclerosis      Will review for Repatha     5   Dizziness   Occasional, with change in position      Current medicines are reviewed at length with the patient today.  The patient does not have concerns regarding medicines.  Signed, Dorris Carnes, MD  03/27/2021 9:24 PM    [address], [place], [place]  [address] Phone: [phone]; Fax: [phone]

## 2021-03-25 ENCOUNTER — Ambulatory Visit: Payer: Medicare HMO | Admitting: Internal Medicine

## 2021-03-25 ENCOUNTER — Encounter: Payer: Self-pay | Admitting: Internal Medicine

## 2021-03-25 ENCOUNTER — Other Ambulatory Visit: Payer: Self-pay

## 2021-03-25 VITALS — BP 138/64 | HR 68 | Ht 72.0 in | Wt 262.4 lb

## 2021-03-25 DIAGNOSIS — R0602 Shortness of breath: Secondary | ICD-10-CM | POA: Diagnosis not present

## 2021-03-25 DIAGNOSIS — I6529 Occlusion and stenosis of unspecified carotid artery: Secondary | ICD-10-CM

## 2021-03-25 DIAGNOSIS — E782 Mixed hyperlipidemia: Secondary | ICD-10-CM | POA: Diagnosis not present

## 2021-03-25 NOTE — Patient Instructions (Signed)
Medication Instructions:  Your physician recommends that you continue on your current medications as directed. Please refer to the Current Medication list given to you today.  *If you need a refill on your cardiac medications before your next appointment, please call your pharmacy*   Lab Work: NONE   If you have labs (blood work) drawn today and your tests are completely normal, you will receive your results only by: [place] (if you have MyChart) OR A paper copy in the mail If you have any lab test that is abnormal or we need to change your treatment, we will call you to review the results.   Testing/Procedures: Your physician has recommended that you have a pulmonary function test. Pulmonary Function Tests are a group of tests that measure how well air moves in and out of your lungs.  Your physician has requested that you have a carotid duplex. This test is an ultrasound of the carotid arteries in your neck. It looks at blood flow through these arteries that supply the brain with blood. Allow one hour for this exam. There are no restrictions or special instructions.    Follow-Up: At [facility], you and your health needs are our priority.  As part of our continuing mission to provide you with exceptional heart care, we have created designated Provider Care Teams.  These Care Teams include your primary Cardiologist (physician) and Advanced Practice Providers (APPs -  Physician Assistants and Nurse Practitioners) who all work together to provide you with the care you need, when you need it.  We recommend signing up for the patient portal called "MyChart".  Sign up information is provided on this After Visit Summary.  MyChart is used to connect with patients for Virtual Visits (Telemedicine).  Patients are able to view lab/test results, encounter notes, upcoming appointments, etc.  Non-urgent messages can be sent to your provider as well.   To learn more about what you can do with  MyChart, go to [email or web address].    Your next appointment:    To Be Determined   The format for your next appointment:   In Person  Provider:   Dorris Carnes, MD   Other Instructions Thank you for choosing [facility]!

## 2021-03-28 ENCOUNTER — Other Ambulatory Visit: Payer: Self-pay | Admitting: Internal Medicine

## 2021-03-29 LAB — SARS CORONAVIRUS 2 (TAT 6-24 HRS): SARS Coronavirus 2: NEGATIVE

## 2021-03-30 ENCOUNTER — Ambulatory Visit (HOSPITAL_COMMUNITY)
Admission: RE | Admit: 2021-03-30 | Discharge: 2021-03-30 | Disposition: A | Discharge status: Home / Self Care | Payer: Medicare HMO | Source: Ambulatory Visit | Attending: Internal Medicine | Admitting: Internal Medicine

## 2021-03-30 ENCOUNTER — Other Ambulatory Visit: Payer: Self-pay

## 2021-03-30 DIAGNOSIS — R0602 Shortness of breath: Secondary | ICD-10-CM | POA: Diagnosis not present

## 2021-03-30 LAB — PULMONARY FUNCTION TEST
DL/VA % pred: 76 %
DL/VA: 2.89 ml/min/mmHg/L
DLCO unc % pred: 56 %
DLCO unc: 14.34 ml/min/mmHg
FEF 25-75 Pre: 4.04 L/sec
FEF2575-%Pred-Pre: 205 %
FEV1-%Pred-Pre: 97 %
FEV1-Pre: 2.9 L
FEV1FVC-%Pred-Pre: 120 %
FEV6-%Pred-Pre: 87 %
FEV6-Pre: 3.41 L
FEV6FVC-%Pred-Pre: 107 %
FVC-%Pred-Pre: 81 %
FVC-Pre: 3.41 L
Pre FEV1/FVC ratio: 85 %
Pre FEV6/FVC Ratio: 100 %
RV % pred: 86 %
RV: 2.47 L
TLC % pred: 80 %
TLC: 6.02 L

## 2021-04-04 ENCOUNTER — Ambulatory Visit (HOSPITAL_COMMUNITY)
Admission: RE | Admit: 2021-04-04 | Discharge: 2021-04-04 | Disposition: A | Discharge status: Home / Self Care | Payer: Medicare HMO | Source: Ambulatory Visit | Attending: Internal Medicine | Admitting: Internal Medicine

## 2021-04-04 ENCOUNTER — Other Ambulatory Visit: Payer: Self-pay

## 2021-04-04 DIAGNOSIS — I6523 Occlusion and stenosis of bilateral carotid arteries: Secondary | ICD-10-CM | POA: Diagnosis not present

## 2021-04-04 DIAGNOSIS — I6529 Occlusion and stenosis of unspecified carotid artery: Secondary | ICD-10-CM | POA: Diagnosis not present

## 2021-04-06 ENCOUNTER — Other Ambulatory Visit: Payer: Self-pay

## 2021-04-06 ENCOUNTER — Ambulatory Visit: Payer: Medicare HMO | Admitting: Pharmacist

## 2021-04-06 DIAGNOSIS — E782 Mixed hyperlipidemia: Secondary | ICD-10-CM | POA: Diagnosis not present

## 2021-04-06 DIAGNOSIS — I6521 Occlusion and stenosis of right carotid artery: Secondary | ICD-10-CM

## 2021-04-06 DIAGNOSIS — I6529 Occlusion and stenosis of unspecified carotid artery: Secondary | ICD-10-CM | POA: Insufficient documentation

## 2021-04-06 MED ORDER — ROSUVASTATIN CALCIUM 5 MG PO TABS: 5.0000 mg | ORAL_TABLET | Freq: Every day | ORAL | 5 refills | Status: DC

## 2021-04-06 NOTE — Patient Instructions (Addendum)
Your most recent LDL in June was 156 and your goal is < 70 because of the carotid plaque build up  Start low dose rosuvastatin 5mg  - 1 tablet daily. If the joint pain comes back, decrease your frequency to 2-3 days per week  Call Jinny Blossom, PharmD if you still do not tolerate the rosuvastatin, and we can try ezetimibe (daily pill that lowers LDL by 20%,) or Repatha (twice monthly injection that lowers LDL by 60%, costs $45/month)  We will want to recheck your cholesterol after you have been on medication for 2-3 months

## 2021-04-06 NOTE — Progress Notes (Addendum)
Patient ID: QUEST TAVENNER                 DOB: 06/09/1935                    MRN: [id number]     HPI: Adam Banks is a 86 y.o. male patient referred to lipid clinic by Dr Harrington Challenger. PMH is significant for HTN, syncope, SOB with exertion. Admitted 03/08/21 with SOB and diastolic CHF. Echo 02/2021 showed EF > 75% and G1DD. Treated with IV Lasix. Carotid ultrasound showed 50-69% right ICA stenosis.  Pt presents today with his wife, pt is hard of hearing. He previously took rosuvastatin 20-40mg  daily and pravastatin 20-40mg  daily and 40mg  twice weekly. Both statins caused joint pain. He initially took statin therapy for a few years and felt fine before joint pain started. Most recently, he stopped pravastatin 2 weeks ago and reports his joint pain resolved within a few days. Not sure what he was taking when June labs were checked, potentially pravastatin a few days a week. Does have [employer] Part D insurance, supplement no longer active.   [place] is $45/month BIN [id number], PCN [id number], GRP [id number], ID [id number]  Current Medications: none Intolerances: rosuvastatin 20-40mg  daily, pravastatin 20-40mg  daily and 40mg  twice a week - joint pain Risk Factors: carotid atherosclerosis LDL goal: 70mg /dL  Diet: Sausage or steak biscuit for breakfast. Sandwich for lunch (banana or [place]). Dinner - meat, potato, vegetable  Exercise: Walks to the mailbox and back twice a day  Family History: Heart attack in his father; Hodgkin's lymphoma in his mother.   Social History: The patient  reports that he has never smoked. He has never used smokeless tobacco. He reports that he does not drink alcohol and does not use drugs.   Labs: 12/30/20: TC 215, TG 131, HDL 35, LDL 156 (potentially on pravastatin a few times a week)  Past Medical History:  Diagnosis Date   Enlarged prostate 2014   History of urinary hesitancy    Hypertension     Current Outpatient Medications on File Prior  to Visit  Medication Sig Dispense Refill   alfuzosin (UROXATRAL) 10 MG 24 hr tablet Take 1 tablet (10 mg total) by mouth daily with breakfast. (Patient taking differently: Take 10 mg by mouth every evening.) 90 tablet 3   aspirin EC 81 MG EC tablet Take 1 tablet (81 mg total) by mouth daily. Swallow whole. 30 tablet 0   finasteride (PROSCAR) 5 MG tablet Take 5 mg by mouth daily.     fluticasone (FLONASE) 50 MCG/ACT nasal spray Place 1 spray into both nostrils 2 (two) times daily. 9.9 mL 0   furosemide (LASIX) 40 MG tablet Take 1 tablet (40 mg total) by mouth daily. 30 tablet 0   losartan (COZAAR) 25 MG tablet Take 1 tablet (25 mg total) by mouth 2 (two) times daily. 30 tablet 0   naproxen sodium (ALEVE) 220 MG tablet Take 220 mg by mouth daily as needed (pain). (Patient not taking: Reported on 03/25/2021)     potassium chloride SA (KLOR-CON) 20 MEQ tablet Take 1 tablet (20 mEq total) by mouth daily. 30 tablet 0   pravastatin (PRAVACHOL) 40 MG tablet Take 40 mg by mouth daily. Mondays and Thursday only     sodium chloride (OCEAN) 0.65 % SOLN nasal spray Place 1 spray into both nostrils as needed for congestion. 15 mL 0   SYMBICORT 80-4.5 MCG/ACT inhaler  Inhale 1 puff into the lungs 2 (two) times daily.     No current facility-administered medications on file prior to visit.    Allergies  Allergen Reactions   Flomax [Tamsulosin Hcl] Other (See Comments)    Patient states that he passed out.    Assessment/Plan:  1. Hyperlipidemia - LDL 156 above goal < 70 due to carotid stenosis. Pt intolerant to high dose of rosuvastatin and lower dose of pravastatin (joint pain with both). Discussed rechallenging with much lower dose of rosuvastatin vs trying PCSK9i therapy. Pt prefers to try low dose of rosuvastatin 5mg  daily first. Advised him to decrease frequency to a few days a week if he doesn't tolerate daily dosing. Can add ezetimibe if needed. Otherwise, if he doesn't tolerate rosuvastatin, will plan  to pursue Repatha ($45/month on his Part D plan). Pt aware to call with tolerability issues, otherwise I'll reach out in a month for an update. Also advised pt to decrease breakfast of sausage biscuits.  Jathen Sudano E. Thong Feeny, PharmD, BCACP, [place] [address] [place], [place] [address] Phone: [phone]; Fax: [phone] 04/06/2021 11:09 AM

## 2021-04-07 DIAGNOSIS — I10 Essential (primary) hypertension: Secondary | ICD-10-CM | POA: Diagnosis not present

## 2021-04-08 ENCOUNTER — Telehealth: Payer: Self-pay | Admitting: Internal Medicine

## 2021-04-08 MED ORDER — FUROSEMIDE 40 MG PO TABS
40.0000 mg | ORAL_TABLET | Freq: Every day | ORAL | 11 refills | Status: DC
Start: 2021-04-08 — End: 2022-02-21

## 2021-04-08 MED ORDER — POTASSIUM CHLORIDE CRYS ER 20 MEQ PO TBCR: 20.0000 meq | EXTENDED_RELEASE_TABLET | Freq: Every day | ORAL | 11 refills | Status: DC

## 2021-04-08 NOTE — Telephone Encounter (Signed)
Refill sent to [facility] in [place]

## 2021-04-08 NOTE — Telephone Encounter (Signed)
*  STAT* If patient is at the pharmacy, call can be transferred to refill team.   1. Which medications need to be refilled? (please list name of each medication and dose if known) LASIX  40 MG  & POTASSIUM 20 MEQ  2. Which pharmacy/location (including street and city if local pharmacy) is medication to be sent to:  [facility],[place]   3. Do they need a 30 day or 90 day supply? [place]

## 2021-04-11 DIAGNOSIS — R600 Localized edema: Secondary | ICD-10-CM | POA: Diagnosis not present

## 2021-04-11 DIAGNOSIS — I10 Essential (primary) hypertension: Secondary | ICD-10-CM | POA: Diagnosis not present

## 2021-04-11 DIAGNOSIS — N182 Chronic kidney disease, stage 2 (mild): Secondary | ICD-10-CM | POA: Diagnosis not present

## 2021-04-11 DIAGNOSIS — E1121 Type 2 diabetes mellitus with diabetic nephropathy: Secondary | ICD-10-CM | POA: Diagnosis not present

## 2021-04-11 DIAGNOSIS — N400 Enlarged prostate without lower urinary tract symptoms: Secondary | ICD-10-CM | POA: Diagnosis not present

## 2021-04-11 DIAGNOSIS — F5221 Male erectile disorder: Secondary | ICD-10-CM | POA: Diagnosis not present

## 2021-04-11 DIAGNOSIS — E785 Hyperlipidemia, unspecified: Secondary | ICD-10-CM | POA: Diagnosis not present

## 2021-04-11 DIAGNOSIS — M5441 Lumbago with sciatica, right side: Secondary | ICD-10-CM | POA: Diagnosis not present

## 2021-04-13 ENCOUNTER — Ambulatory Visit: Payer: Medicare HMO | Admitting: Student

## 2021-05-02 ENCOUNTER — Telehealth: Payer: Self-pay

## 2021-05-02 DIAGNOSIS — E782 Mixed hyperlipidemia: Secondary | ICD-10-CM

## 2021-05-02 NOTE — Telephone Encounter (Signed)
-----   Message from Fay Records, MD sent at 04/30/2021  9:32 PM EDT ----- LDL is too high  Needs to be close to 70, at least below 90 It does not look like he has ever tried Crestor    I woulld like him to stop pravastatin and try Crestor 10 mg   every day Follow up lpids in 8 wks with AST and CK

## 2021-05-02 NOTE — Telephone Encounter (Signed)
Pt previously intolerant to rosuvastatin 20-40mg  daily, pravastatin 20-40mg  daily and 40mg  twice weekly. I rechallenged him on lower dose of rosuvastatin 5mg  daily when I saw him 9/28 in lipid clinic with follow up play to pursue Repatha if not tolerating well.   The labs scanned in that show LDL of 103 are a month old and reflect pt before he started on rosuvastatin. Called pt who reports tolerating rosuvastatin 5mg  daily well. Placed orders for [facility] near his home, he will go by for fasting labs in about 2 weeks for update labwork since starting rosuvastatin. Can titrate dose if needed pending lab results.

## 2021-05-02 NOTE — Telephone Encounter (Signed)
Spoke with the pts wife re: the pts recent Lipid panel and she reports that she would like [place] to send them to [place] for review since the pt had seen her late Sept re: his labs and they had talked about plans if his LDL was still elevated... will forward to her.

## 2021-05-02 NOTE — Progress Notes (Signed)
History of Present Illness: Here for f/u of BPH--on med Rx.  He takes both alfuzosin and finasteride.  His urinary situation is stable since last year.  He has not had any urinary tract infections.  He is also on a morning diuretic.  With that, he is only waking up once a night to urinate.  Past Medical History:  Diagnosis Date   Enlarged prostate 2014   History of urinary hesitancy    Hypertension     Past Surgical History:  Procedure Laterality Date   HYDROCELE EXCISION Left 05/05/2014   Procedure: LEFT HYDROCELECTOMY ADULT;  Surgeon: Jorja Loa, MD;  Location: [facility];  Service: Urology;  Laterality: Left;   INGUINAL HERNIA REPAIR Right 1977   TONSILLECTOMY      Home Medications:  Allergies as of 05/03/2021       Reactions   Flomax [tamsulosin Hcl] Other (See Comments)   Patient states that he passed out.        Medication List        Accurate as of May 02, 2021  7:32 PM. If you have any questions, ask your nurse or doctor.          alfuzosin 10 MG 24 hr tablet Commonly known as: UROXATRAL Take 1 tablet (10 mg total) by mouth daily with breakfast. What changed: when to take this   aspirin 81 MG EC tablet Take 1 tablet (81 mg total) by mouth daily. Swallow whole.   finasteride 5 MG tablet Commonly known as: PROSCAR Take 5 mg by mouth daily.   fluticasone 50 MCG/ACT nasal spray Commonly known as: FLONASE Place 1 spray into both nostrils 2 (two) times daily.   furosemide 40 MG tablet Commonly known as: LASIX Take 1 tablet (40 mg total) by mouth daily.   losartan 25 MG tablet Commonly known as: COZAAR Take 1 tablet (25 mg total) by mouth 2 (two) times daily.   naproxen sodium 220 MG tablet Commonly known as: ALEVE Take 220 mg by mouth daily as needed (pain).   potassium chloride SA 20 MEQ tablet Commonly known as: KLOR-CON Take 1 tablet (20 mEq total) by mouth daily.   rosuvastatin 5 MG tablet Commonly known as: CRESTOR Take 1 tablet  (5 mg total) by mouth daily.   sodium chloride 0.65 % Soln nasal spray Commonly known as: OCEAN Place 1 spray into both nostrils as needed for congestion.   Symbicort 80-4.5 MCG/ACT inhaler Generic drug: budesonide-formoterol Inhale 1 puff into the lungs 2 (two) times daily.        Allergies:  Allergies  Allergen Reactions   Flomax [Tamsulosin Hcl] Other (See Comments)    Patient states that he passed out.    Family History  Problem Relation Age of Onset   Hodgkin's lymphoma Mother    Heart attack Father     Social History:  reports that he has never smoked. He has never used smokeless tobacco. He reports that he does not drink alcohol and does not use drugs.  ROS: A complete review of systems was performed.  All systems are negative except for pertinent findings as noted.  Physical Exam:  Vital signs in last 24 hours: There were no vitals taken for this visit. Constitutional:  Alert and oriented, No acute distress Cardiovascular: Regular rate  Respiratory: Normal respiratory effort GI: Abdomen is soft, nontender, nondistended, no abdominal masses. No CVAT.  No inguinal hernias. Genitourinary: Normal uncircumcised male phallus, testes are descended bilaterally and non-tender and without masses,  scrotum is normal in appearance without lesions or masses, perineum is normal on inspection.  Prostate 40 g, symmetrical, nonnodular, nontender. Lymphatic: No lymphadenopathy Neurologic: Grossly intact, no focal deficits Psychiatric: Normal mood and affect  I have reviewed prior pt notes  I have reviewed urinalysis results   Impression/Assessment:  BPH with lower urinary tract symptoms-stable on dual medical therapy  Plan:  I will see back in a year for routine check

## 2021-05-02 NOTE — Telephone Encounter (Signed)
Pts wife is asking when the pt needs to follow up with Dr. Harrington Challenger in [place] based on his previous testing ... PFT and Carotid. Will forward to her for her review.

## 2021-05-03 ENCOUNTER — Ambulatory Visit: Payer: Medicare HMO | Admitting: Urology

## 2021-05-03 ENCOUNTER — Encounter: Payer: Self-pay | Admitting: Urology

## 2021-05-03 ENCOUNTER — Other Ambulatory Visit: Payer: Self-pay

## 2021-05-03 VITALS — BP 177/80 | HR 76 | Temp 98.0°F | Wt 260.0 lb

## 2021-05-03 DIAGNOSIS — R351 Nocturia: Secondary | ICD-10-CM

## 2021-05-03 DIAGNOSIS — R350 Frequency of micturition: Secondary | ICD-10-CM

## 2021-05-03 DIAGNOSIS — N400 Enlarged prostate without lower urinary tract symptoms: Secondary | ICD-10-CM

## 2021-05-03 NOTE — Progress Notes (Signed)
Urological Symptom Review  Patient is experiencing the following symptoms: Get up at night to urinate   Review of Systems  Gastrointestinal (upper)  : Negative for upper GI symptoms  Gastrointestinal (lower) : Negative for lower GI symptoms  Constitutional : Negative for symptoms  Skin: Negative for skin symptoms  Eyes: Negative for eye symptoms  Ear/Nose/Throat : Negative for Ear/Nose/Throat symptoms  Hematologic/Lymphatic: Negative for Hematologic/Lymphatic symptoms  Cardiovascular : Negative for cardiovascular symptoms  Respiratory : Shortness of breath  Endocrine: Negative for endocrine symptoms  Musculoskeletal: Joint pain  Neurological: Dizziness  Psychologic: Negative for psychiatric symptoms

## 2021-05-05 NOTE — Telephone Encounter (Signed)
I have sent staff message to Dr. Harrington Challenger asking if we need to place order for CPX.

## 2021-05-07 ENCOUNTER — Other Ambulatory Visit: Payer: Self-pay | Admitting: Urology

## 2021-05-07 DIAGNOSIS — R351 Nocturia: Secondary | ICD-10-CM

## 2021-05-16 DIAGNOSIS — E782 Mixed hyperlipidemia: Secondary | ICD-10-CM | POA: Diagnosis not present

## 2021-05-17 ENCOUNTER — Telehealth: Payer: Self-pay | Admitting: Pharmacist

## 2021-05-17 ENCOUNTER — Other Ambulatory Visit: Payer: Self-pay | Admitting: Pharmacist

## 2021-05-17 DIAGNOSIS — E782 Mixed hyperlipidemia: Secondary | ICD-10-CM

## 2021-05-17 LAB — HEPATIC FUNCTION PANEL
ALT: 15 IU/L (ref 0–44)
AST: 21 IU/L (ref 0–40)
Albumin: 3.9 g/dL (ref 3.6–4.6)
Alkaline Phosphatase: 69 IU/L (ref 44–121)
Bilirubin Total: 0.6 mg/dL (ref 0.0–1.2)
Bilirubin, Direct: 0.14 mg/dL (ref 0.00–0.40)
Total Protein: 5.9 g/dL — ABNORMAL LOW (ref 6.0–8.5)

## 2021-05-17 LAB — LIPID PANEL
Chol/HDL Ratio: 4.2 ratio (ref 0.0–5.0)
Cholesterol, Total: 157 mg/dL (ref 100–199)
HDL: 37 mg/dL — ABNORMAL LOW (ref >39)
LDL Chol Calc (NIH): 96 mg/dL (ref 0–99)
Triglycerides: 131 mg/dL (ref 0–149)
VLDL Cholesterol Cal: 24 mg/dL (ref 5–40)

## 2021-05-17 MED ORDER — EZETIMIBE 10 MG PO TABS: 10.0000 mg | ORAL_TABLET | Freq: Every day | ORAL | 11 refills | Status: DC

## 2021-05-17 NOTE — Telephone Encounter (Signed)
LDL has improved from 156 to 96 after starting rosuvastatin 5mg  daily. Goal LDL < 70. Previously intolerant to rosuvastatin 20-40mg  daily, pravastatin 20-40mg  daily and 40mg  twice a week (joint pain). Spoke with pt's wife, will add ezetimibe 10mg  daily and recheck labs in 2 months at [facility] near his home.

## 2021-06-08 DIAGNOSIS — I10 Essential (primary) hypertension: Secondary | ICD-10-CM | POA: Diagnosis not present

## 2021-06-08 DIAGNOSIS — E782 Mixed hyperlipidemia: Secondary | ICD-10-CM | POA: Diagnosis not present

## 2021-07-08 DIAGNOSIS — I10 Essential (primary) hypertension: Secondary | ICD-10-CM | POA: Diagnosis not present

## 2021-07-12 ENCOUNTER — Telehealth: Payer: Self-pay | Admitting: Pharmacist

## 2021-07-12 NOTE — Telephone Encounter (Signed)
Called pt to remind him to have lipids checked since starting Zetia. His wife states he had labs checked last Friday at PCP office and will see PCP tomorrow for follow up. I cannot see labs in Epic or in [place]. Provided her with our fax # and she will have them fax results to [place] tomorrow.

## 2021-07-13 DIAGNOSIS — N182 Chronic kidney disease, stage 2 (mild): Secondary | ICD-10-CM | POA: Diagnosis not present

## 2021-07-13 DIAGNOSIS — F5221 Male erectile disorder: Secondary | ICD-10-CM | POA: Diagnosis not present

## 2021-07-13 DIAGNOSIS — N400 Enlarged prostate without lower urinary tract symptoms: Secondary | ICD-10-CM | POA: Diagnosis not present

## 2021-07-13 DIAGNOSIS — Z0001 Encounter for general adult medical examination with abnormal findings: Secondary | ICD-10-CM | POA: Diagnosis not present

## 2021-07-13 DIAGNOSIS — M5441 Lumbago with sciatica, right side: Secondary | ICD-10-CM | POA: Diagnosis not present

## 2021-07-13 DIAGNOSIS — R600 Localized edema: Secondary | ICD-10-CM | POA: Diagnosis not present

## 2021-07-13 DIAGNOSIS — I10 Essential (primary) hypertension: Secondary | ICD-10-CM | POA: Diagnosis not present

## 2021-07-13 DIAGNOSIS — E1121 Type 2 diabetes mellitus with diabetic nephropathy: Secondary | ICD-10-CM | POA: Diagnosis not present

## 2021-07-13 DIAGNOSIS — E785 Hyperlipidemia, unspecified: Secondary | ICD-10-CM | POA: Diagnosis not present

## 2021-07-15 MED ORDER — ROSUVASTATIN CALCIUM 10 MG PO TABS: 10.0000 mg | ORAL_TABLET | Freq: Every day | ORAL | 11 refills | Status: DC

## 2021-07-15 NOTE — Telephone Encounter (Signed)
Did not receive labs, I called over to his PCP office and had them refax.  Cholesterol checked 07/09/21: TC 144, TG 102, HDL 41, LDL 84, LFTs normal  Baseline LDL 156. Improved to 96 after adding rosuvastatin 5mg  daily (intolerant to rosuvastatin 20-40mg  daily, pravastatin 20-40mg  daily and 40mg  twice weekly - joint pain).   I added ezetimibe 10mg  daily in early November, LDL down slightly to 84 which is only about a 12% reduction (typically see 20-25% reduction which would have brought pt closer to goal LDL < 70).  Pt still taking rosuvastatin 5mg  daily and ezetimibe 10mg  daily and is tolerating well. Will increase rosuvastatin to 10mg  daily. Pt and wife are aware of plan. Will call in another 2 months to follow up with tolerability and schedule labs.

## 2021-08-09 DIAGNOSIS — I10 Essential (primary) hypertension: Secondary | ICD-10-CM | POA: Diagnosis not present

## 2021-08-09 DIAGNOSIS — E782 Mixed hyperlipidemia: Secondary | ICD-10-CM | POA: Diagnosis not present

## 2021-09-08 DIAGNOSIS — H00025 Hordeolum internum left lower eyelid: Secondary | ICD-10-CM | POA: Diagnosis not present

## 2021-09-09 ENCOUNTER — Telehealth: Payer: Self-pay | Admitting: Pharmacist

## 2021-09-09 NOTE — Telephone Encounter (Signed)
Called pt to follow up with tolerability since increasing rosuvastatin from 5mg  to 10mg  daily (previously intolerant to rosuvastatin 20-40mg  daily, pravastatin 20-40mg  daily and 40mg  2x per week). Continues on ezetimibe. Will need follow up labs scheduled. ? ?Pt did not answer and there was no option to leave voicemail, message asked for a remote access code? Will try back later. ?

## 2021-09-14 NOTE — Telephone Encounter (Signed)
Called pt and spoke with his wife (pt hard of hearing), reports pt feeling a little sore but not like how he felt on other statins. [place] with continuing rosuvastatin '10mg'$  daily and ezetimibe '10mg'$  daily. Sees PCP again on April 6th and has labs prior on April 3rd, reports lipids will be checked then. ?

## 2021-10-05 DIAGNOSIS — L821 Other seborrheic keratosis: Secondary | ICD-10-CM | POA: Diagnosis not present

## 2021-10-05 DIAGNOSIS — B078 Other viral warts: Secondary | ICD-10-CM | POA: Diagnosis not present

## 2021-10-07 DIAGNOSIS — I10 Essential (primary) hypertension: Secondary | ICD-10-CM | POA: Diagnosis not present

## 2021-10-07 DIAGNOSIS — K219 Gastro-esophageal reflux disease without esophagitis: Secondary | ICD-10-CM | POA: Diagnosis not present

## 2021-10-10 DIAGNOSIS — I10 Essential (primary) hypertension: Secondary | ICD-10-CM | POA: Diagnosis not present

## 2021-10-13 DIAGNOSIS — N400 Enlarged prostate without lower urinary tract symptoms: Secondary | ICD-10-CM | POA: Diagnosis not present

## 2021-10-13 DIAGNOSIS — E1121 Type 2 diabetes mellitus with diabetic nephropathy: Secondary | ICD-10-CM | POA: Diagnosis not present

## 2021-10-13 DIAGNOSIS — I10 Essential (primary) hypertension: Secondary | ICD-10-CM | POA: Diagnosis not present

## 2021-10-13 DIAGNOSIS — M5441 Lumbago with sciatica, right side: Secondary | ICD-10-CM | POA: Diagnosis not present

## 2021-10-13 DIAGNOSIS — N182 Chronic kidney disease, stage 2 (mild): Secondary | ICD-10-CM | POA: Diagnosis not present

## 2021-10-13 DIAGNOSIS — R600 Localized edema: Secondary | ICD-10-CM | POA: Diagnosis not present

## 2021-10-13 DIAGNOSIS — F5221 Male erectile disorder: Secondary | ICD-10-CM | POA: Diagnosis not present

## 2021-10-13 DIAGNOSIS — E785 Hyperlipidemia, unspecified: Secondary | ICD-10-CM | POA: Diagnosis not present

## 2021-10-18 ENCOUNTER — Telehealth: Payer: Self-pay | Admitting: Pharmacist

## 2021-10-18 NOTE — Telephone Encounter (Signed)
Called PCP to have labs faxed over. ? ?Lipids checked 10/10/21: TC 125, TG 86, HDL 42, LDL 66. ? ?Currently taking rosuvastatin '10mg'$  daily and ezetimibe '10mg'$  daily, LDL is now at goal < 70 given his carotid atherosclerosis. Called pt who reports tolerating both meds well. No medication changes needed. ?

## 2021-10-28 DIAGNOSIS — M9903 Segmental and somatic dysfunction of lumbar region: Secondary | ICD-10-CM | POA: Diagnosis not present

## 2021-10-28 DIAGNOSIS — M9902 Segmental and somatic dysfunction of thoracic region: Secondary | ICD-10-CM | POA: Diagnosis not present

## 2021-10-28 DIAGNOSIS — M5441 Lumbago with sciatica, right side: Secondary | ICD-10-CM | POA: Diagnosis not present

## 2021-10-28 DIAGNOSIS — M9905 Segmental and somatic dysfunction of pelvic region: Secondary | ICD-10-CM | POA: Diagnosis not present

## 2021-11-02 DIAGNOSIS — H2513 Age-related nuclear cataract, bilateral: Secondary | ICD-10-CM | POA: Diagnosis not present

## 2022-01-19 DIAGNOSIS — I10 Essential (primary) hypertension: Secondary | ICD-10-CM | POA: Diagnosis not present

## 2022-01-19 DIAGNOSIS — E1165 Type 2 diabetes mellitus with hyperglycemia: Secondary | ICD-10-CM | POA: Diagnosis not present

## 2022-01-27 DIAGNOSIS — N182 Chronic kidney disease, stage 2 (mild): Secondary | ICD-10-CM | POA: Diagnosis not present

## 2022-01-27 DIAGNOSIS — I10 Essential (primary) hypertension: Secondary | ICD-10-CM | POA: Diagnosis not present

## 2022-01-27 DIAGNOSIS — E1121 Type 2 diabetes mellitus with diabetic nephropathy: Secondary | ICD-10-CM | POA: Diagnosis not present

## 2022-01-27 DIAGNOSIS — N400 Enlarged prostate without lower urinary tract symptoms: Secondary | ICD-10-CM | POA: Diagnosis not present

## 2022-01-27 DIAGNOSIS — E785 Hyperlipidemia, unspecified: Secondary | ICD-10-CM | POA: Diagnosis not present

## 2022-01-27 DIAGNOSIS — R600 Localized edema: Secondary | ICD-10-CM | POA: Diagnosis not present

## 2022-01-27 DIAGNOSIS — F5221 Male erectile disorder: Secondary | ICD-10-CM | POA: Diagnosis not present

## 2022-01-27 DIAGNOSIS — M5441 Lumbago with sciatica, right side: Secondary | ICD-10-CM | POA: Diagnosis not present

## 2022-02-08 ENCOUNTER — Telehealth: Payer: Self-pay

## 2022-02-08 NOTE — Telephone Encounter (Signed)
Wife/spouse notified and verbalized understanding- no questions or concerns.

## 2022-02-08 NOTE — Telephone Encounter (Signed)
-----   Message from Nuala Alpha, LPN sent at 0/07/2377  7:49 AM EDT ----- Linna Hoff pt   ----- Message ----- From: Fay Records, MD Sent: 02/07/2022   9:03 PM EDT To: Rebeca Alert [facility] [address] Triage  LIpids look good   Keep on same meds

## 2022-02-21 ENCOUNTER — Other Ambulatory Visit: Payer: Self-pay | Admitting: Internal Medicine

## 2022-04-16 ENCOUNTER — Other Ambulatory Visit: Payer: Self-pay | Admitting: Internal Medicine

## 2022-04-28 ENCOUNTER — Telehealth: Payer: Self-pay | Admitting: Internal Medicine

## 2022-04-28 MED ORDER — POTASSIUM CHLORIDE CRYS ER 20 MEQ PO TBCR: 20.0000 meq | EXTENDED_RELEASE_TABLET | Freq: Every day | ORAL | 1 refills | Status: DC

## 2022-04-28 NOTE — Telephone Encounter (Signed)
*  STAT* If patient is at the pharmacy, call can be transferred to refill team.   1. Which medications need to be refilled? (please list name of each medication and dose if known)   potassium chloride SA (KLOR-CON M) 20 MEQ tablet    2. Which pharmacy/location (including street and city if local pharmacy) is medication to be sent to?  [place], [place] - [id number] [place] [address] [place] Phone:  [phone]         3. Do they need a 30 day or 90 day supply?  30 day

## 2022-04-28 NOTE — Telephone Encounter (Signed)
Completed.

## 2022-05-01 NOTE — Progress Notes (Signed)
History of Present Illness: 87 yo male here for followup of BPH w/ sx's. On dual medical therapy--finasteride, alfuzosin.  He is voiding fairly well.  Nocturia x1-2.  No bothersome urinary symptoms currently.  He has had no infections over the past year.  He has a h/o hydrocelectomy.  Biggest complaint is constipation.  He does not eat enough fiber.  He does drink sodas more than water.  Past Medical History:  Diagnosis Date   Enlarged prostate 2014   History of urinary hesitancy    Hypertension     Past Surgical History:  Procedure Laterality Date   HYDROCELE EXCISION Left 05/05/2014   Procedure: LEFT HYDROCELECTOMY ADULT;  Surgeon: Jorja Loa, MD;  Location: [facility];  Service: Urology;  Laterality: Left;   INGUINAL HERNIA REPAIR Right 1977   TONSILLECTOMY      Home Medications:  Allergies as of 05/02/2022       Reactions   Flomax [tamsulosin Hcl] Other (See Comments)   Patient states that he passed out.        Medication List        Accurate as of May 01, 2022  8:53 PM. If you have any questions, ask your nurse or doctor.          alfuzosin 10 MG 24 hr tablet Commonly known as: UROXATRAL Take 1 tablet (10 mg total) by mouth every evening.   aspirin EC 81 MG tablet Take 1 tablet (81 mg total) by mouth daily. Swallow whole.   ezetimibe 10 MG tablet Commonly known as: ZETIA Take 1 tablet (10 mg total) by mouth daily.   finasteride 5 MG tablet Commonly known as: PROSCAR Take 5 mg by mouth daily.   fluticasone 50 MCG/ACT nasal spray Commonly known as: FLONASE Place 1 spray into both nostrils 2 (two) times daily.   furosemide 40 MG tablet Commonly known as: LASIX Take 1 tablet by mouth once daily   losartan 25 MG tablet Commonly known as: COZAAR Take 1 tablet (25 mg total) by mouth 2 (two) times daily.   naproxen sodium 220 MG tablet Commonly known as: ALEVE Take 220 mg by mouth daily as needed (pain).   potassium chloride SA 20 MEQ  tablet Commonly known as: KLOR-CON M Take 1 tablet (20 mEq total) by mouth daily.   rosuvastatin 10 MG tablet Commonly known as: CRESTOR Take 1 tablet (10 mg total) by mouth daily.   sodium chloride 0.65 % Soln nasal spray Commonly known as: OCEAN Place 1 spray into both nostrils as needed for congestion.        Allergies:  Allergies  Allergen Reactions   Flomax [Tamsulosin Hcl] Other (See Comments)    Patient states that he passed out.    Family History  Problem Relation Age of Onset   Hodgkin's lymphoma Mother    Heart attack Father     Social History:  reports that he has never smoked. He has never used smokeless tobacco. He reports that he does not drink alcohol and does not use drugs.  ROS: A complete review of systems was performed.  All systems are negative except for pertinent findings as noted.  Physical Exam:  Vital signs in last 24 hours: There were no vitals taken for this visit. Constitutional:  Alert and oriented, No acute distress Cardiovascular: Regular rate  Respiratory: Normal respiratory effort Lymphatic: No lymphadenopathy Neurologic: Grossly intact, no focal deficits Psychiatric: Normal mood and affect  I have reviewed prior pt notes  I have  reviewed urinalysis results  I have independently reviewed bladder scan-residual volume 90 mL     Impression/Assessment:  1.  BPH with symptoms-on dual medical therapy, over all doing well with these  2.  Constipation  Plan:  1.  Eat more fiber, add Citrucel or Metamucil, drink more water, less sodas  2.  Continue dual medical therapy  3.  I will have him come back in 1 year for recheck

## 2022-05-02 ENCOUNTER — Ambulatory Visit: Payer: Medicare HMO | Admitting: Urology

## 2022-05-02 VITALS — BP 147/73 | HR 76 | Ht 72.0 in | Wt 248.0 lb

## 2022-05-02 DIAGNOSIS — N401 Enlarged prostate with lower urinary tract symptoms: Secondary | ICD-10-CM | POA: Diagnosis not present

## 2022-05-02 DIAGNOSIS — K5900 Constipation, unspecified: Secondary | ICD-10-CM

## 2022-05-02 DIAGNOSIS — R350 Frequency of micturition: Secondary | ICD-10-CM

## 2022-05-02 DIAGNOSIS — R351 Nocturia: Secondary | ICD-10-CM

## 2022-05-02 DIAGNOSIS — R001 Bradycardia, unspecified: Secondary | ICD-10-CM | POA: Diagnosis not present

## 2022-05-02 DIAGNOSIS — N400 Enlarged prostate without lower urinary tract symptoms: Secondary | ICD-10-CM

## 2022-05-02 DIAGNOSIS — R55 Syncope and collapse: Secondary | ICD-10-CM | POA: Diagnosis not present

## 2022-05-02 LAB — URINALYSIS, ROUTINE W REFLEX MICROSCOPIC
Bilirubin, UA: NEGATIVE
Glucose, UA: NEGATIVE
Ketones, UA: NEGATIVE
Leukocytes,UA: NEGATIVE
Nitrite, UA: NEGATIVE
Protein,UA: NEGATIVE
RBC, UA: NEGATIVE
Specific Gravity, UA: 1.02 (ref 1.005–1.030)
Urobilinogen, Ur: 0.2 mg/dL (ref 0.2–1.0)
pH, UA: 6 (ref 5.0–7.5)

## 2022-05-02 MED ORDER — FINASTERIDE 5 MG PO TABS: 5.0000 mg | ORAL_TABLET | Freq: Every day | ORAL | 3 refills | Status: DC

## 2022-05-02 MED ORDER — ALFUZOSIN HCL ER 10 MG PO TB24: 10.0000 mg | ORAL_TABLET | Freq: Every evening | ORAL | 3 refills | Status: DC

## 2022-05-03 ENCOUNTER — Ambulatory Visit: Payer: Medicare HMO | Admitting: Physician Assistant

## 2022-05-10 ENCOUNTER — Other Ambulatory Visit: Payer: Self-pay | Admitting: Internal Medicine

## 2022-05-13 ENCOUNTER — Other Ambulatory Visit: Payer: Self-pay | Admitting: Internal Medicine

## 2022-05-15 ENCOUNTER — Other Ambulatory Visit: Payer: Self-pay | Admitting: *Deleted

## 2022-05-15 MED ORDER — EZETIMIBE 10 MG PO TABS: 10.0000 mg | ORAL_TABLET | Freq: Every day | ORAL | 0 refills | Status: DC

## 2022-05-17 DIAGNOSIS — L821 Other seborrheic keratosis: Secondary | ICD-10-CM | POA: Diagnosis not present

## 2022-05-29 NOTE — Progress Notes (Signed)
Cardiology Office Note:    Date:  06/12/2022   ID:  Adam Banks, DOB 1935/03/14, MRN [id number]  PCP:  Celene Squibb, MD  [place] [facility] Cardiologist:  Dorris Carnes, MD     Referring MD: Celene Squibb, MD   Chief Complaint:  Follow-up     History of Present Illness:   Adam Banks is a 87 y.o. male with history of syncope due to dehydration and orthostasis 2013, some sinus bradycardia not felt to be the cause. Chest pain 2020 echo normal.   Patient saw Dr. Harrington Challenger  03/2021  after ED visit with congestion treated with lasix. Also has atherosclerosis on CT and intolerant to higher dose statins. Referred to lipid clinic. Echo 03/09/22 mild aortic stenosis can't exclude bicuspid valve.  Patient comes in for f/u with his wife. He is cutting wood and walks his driveway~300 ft 3 times a day. No chest pain, dizziness, edema. He has chronic DOE and stops and rests and can start again.     Past Medical History:  Diagnosis Date   Enlarged prostate 2014   History of urinary hesitancy    Hypertension    Current Medications: Current Meds  Medication Sig   alfuzosin (UROXATRAL) 10 MG 24 hr tablet Take 1 tablet (10 mg total) by mouth every evening.   aspirin EC 81 MG EC tablet Take 1 tablet (81 mg total) by mouth daily. Swallow whole.   ezetimibe (ZETIA) 10 MG tablet Take 1 tablet (10 mg total) by mouth daily.   finasteride (PROSCAR) 5 MG tablet Take 1 tablet (5 mg total) by mouth daily.   fluticasone (FLONASE) 50 MCG/ACT nasal spray Place 1 spray into both nostrils 2 (two) times daily.   furosemide (LASIX) 40 MG tablet Take 1 tablet by mouth once daily   losartan (COZAAR) 25 MG tablet Take 1 tablet (25 mg total) by mouth 2 (two) times daily.   potassium chloride SA (KLOR-CON M) 20 MEQ tablet Take 1 tablet (20 mEq total) by mouth daily.   rosuvastatin (CRESTOR) 10 MG tablet Take 1 tablet (10 mg total) by mouth daily.   sodium chloride (OCEAN) 0.65 % SOLN nasal spray Place 1  spray into both nostrils as needed for congestion.    Allergies:   Flomax [tamsulosin hcl]   Social History   Tobacco Use   Smoking status: Never   Smokeless tobacco: Never  Vaping Use   Vaping Use: Never used  Substance Use Topics   Alcohol use: No   Drug use: No    Family Hx: [name] family history includes Heart attack in his father; Hodgkin's lymphoma in his mother.  ROS     Physical Exam:    VS:  BP (!) 112/58   Pulse 65   Ht 6' (1.829 m)   Wt 257 lb 6.4 oz (116.8 kg)   SpO2 97%   BMI 34.91 kg/m     Wt Readings from Last 3 Encounters:  06/12/22 257 lb 6.4 oz (116.8 kg)  05/02/22 248 lb (112.5 kg)  05/03/21 260 lb (117.9 kg)    Physical Exam  GEN: Obese, in no acute distress  Neck: no JVD, carotid bruits, or masses Cardiac:RRR; no murmurs, rubs, or gallops  Respiratory:  clear to auscultation bilaterally, normal work of breathing GI: soft, nontender, nondistended, + BS Ext: without cyanosis, clubbing, or edema, Good distal pulses bilaterally MS: no deformity or atrophy  Skin: warm and dry, no rash Neuro:  Alert and  Oriented x 3, Strength and sensation are intact Psych: euthymic mood, full affect        EKGs/Labs/Other Test Reviewed:    EKG:  EKG is   ordered today.  The ekg ordered today demonstrates NSR, normal EKG  Recent Labs: No results found for requested labs within last 365 days.   Recent Lipid Panel No results for input(s): "CHOL", "TRIG", "HDL", "VLDL", "LDLCALC", "LDLDIRECT" in the last 8760 hours.   Prior CV Studies:   Echo 02/2021 IMPRESSIONS     1. Left ventricular ejection fraction, by estimation, is >75%. The left  ventricle has hyperdynamic function. The left ventricle has no regional  wall motion abnormalities. There is moderate asymmetric left ventricular  hypertrophy of the septal segment.  Left ventricular diastolic parameters are consistent with Grade I  diastolic dysfunction (impaired relaxation).   2. Right  ventricular systolic function is normal. The right ventricular  size is normal. Tricuspid regurgitation signal is inadequate for assessing  PA pressure.   3. The pericardial effusion is anterior to the right ventricle.   4. The mitral valve is grossly normal. Trivial mitral valve  regurgitation.   5. The aortic valve has an indeterminant number of cusps. There is  moderate calcification of the aortic valve. Aortic valve regurgitation is  not visualized. Mild aortic valve stenosis. Aortic valve area, by VTI  measures 1.82 cm. Aortic valve mean  gradient measures 6.0 mmHg.   6. The inferior vena cava is normal in size with greater than 50%  respiratory variability, suggesting right atrial pressure of 3 mmHg.   Comparison(s): Prior images reviewed side by side. Vigorous LVEF. Aortic  valve is moderately calcified, cannot exclude bicuspid valve on review of  current and prior images. Mild aortic stenosis.   Carotid dopplers 03/2021 IMPRESSION: Right:   Heterogeneous and partially calcified plaque at the right carotid bifurcation, with discordant results regarding degree of stenosis by established duplex criteria. Peak velocity suggests 50%-69% stenosis, with the ICA/ CCA ratio suggesting a lesser degree of stenosis. If establishing a more accurate degree of stenosis is required, cerebral angiogram should be considered, or as a second best test, CTA.   Left:   Color duplex indicates minimal heterogeneous and calcified plaque, with no hemodynamically significant stenosis by duplex criteria in the extracranial cerebrovascular circulation.   Signed,   Dulcy Fanny. Dellia Nims, RPVI   [facility]   [facility]     Electronically Signed   By: Corrie Mckusick D.O.   On: 04/04/2021 14:11     Risk Assessment/Calculations/Metrics:              ASSESSMENT & PLAN:   No problem-specific Assessment & Plan notes found for this encounter.    DOE normal LVEF on echo and mild AS 03/2021. Some of this may be due to deconditioning and obesity but will repeat echo to make sure no progression of AS. 2 gm sodium diet. Full labs reviewed in [facility] and stable.  LE edema on lasix and compression hose. Getting extra salt-2 gm sodium diet.  Mild aortic stenosis on echo 03/2021-repeat echo.  HLD on rosuvastatin and zetia. LDL 71 on recent labs   Carotid stenosis 50-69% RICA on rosuvastatin and zetia. Needs repeat dopplers  Obesity-exercise and weight loss discussed           Dispo:  No follow-ups on file.   Medication Adjustments/Labs and Tests Ordered: Current medicines are reviewed at length with the patient today.  Concerns  regarding medicines are outlined above.  Tests Ordered: Orders Placed This Encounter  Procedures   EKG 12-Lead   Medication Changes: No orders of the defined types were placed in this encounter.  Signed, Ermalinda Barrios, PA-C  06/12/2022 1:49 PM    [address], [place], [place]  [address] Phone: [phone]; Fax: [phone]

## 2022-05-30 DIAGNOSIS — I10 Essential (primary) hypertension: Secondary | ICD-10-CM | POA: Diagnosis not present

## 2022-06-06 DIAGNOSIS — M5441 Lumbago with sciatica, right side: Secondary | ICD-10-CM | POA: Diagnosis not present

## 2022-06-06 DIAGNOSIS — I10 Essential (primary) hypertension: Secondary | ICD-10-CM | POA: Diagnosis not present

## 2022-06-06 DIAGNOSIS — N182 Chronic kidney disease, stage 2 (mild): Secondary | ICD-10-CM | POA: Diagnosis not present

## 2022-06-06 DIAGNOSIS — E1122 Type 2 diabetes mellitus with diabetic chronic kidney disease: Secondary | ICD-10-CM | POA: Diagnosis not present

## 2022-06-06 DIAGNOSIS — R600 Localized edema: Secondary | ICD-10-CM | POA: Diagnosis not present

## 2022-06-06 DIAGNOSIS — N400 Enlarged prostate without lower urinary tract symptoms: Secondary | ICD-10-CM | POA: Diagnosis not present

## 2022-06-06 DIAGNOSIS — Z23 Encounter for immunization: Secondary | ICD-10-CM | POA: Diagnosis not present

## 2022-06-06 DIAGNOSIS — E785 Hyperlipidemia, unspecified: Secondary | ICD-10-CM | POA: Diagnosis not present

## 2022-06-06 DIAGNOSIS — F5221 Male erectile disorder: Secondary | ICD-10-CM | POA: Diagnosis not present

## 2022-06-12 ENCOUNTER — Encounter: Payer: Self-pay | Admitting: Physician Assistant

## 2022-06-12 ENCOUNTER — Ambulatory Visit: Payer: Medicare HMO | Attending: Physician Assistant | Admitting: Physician Assistant

## 2022-06-12 VITALS — BP 112/58 | HR 65 | Ht 72.0 in | Wt 257.4 lb

## 2022-06-12 DIAGNOSIS — I350 Nonrheumatic aortic (valve) stenosis: Secondary | ICD-10-CM | POA: Diagnosis not present

## 2022-06-12 DIAGNOSIS — R600 Localized edema: Secondary | ICD-10-CM | POA: Diagnosis not present

## 2022-06-12 DIAGNOSIS — E669 Obesity, unspecified: Secondary | ICD-10-CM | POA: Diagnosis not present

## 2022-06-12 DIAGNOSIS — R0609 Other forms of dyspnea: Secondary | ICD-10-CM | POA: Diagnosis not present

## 2022-06-12 DIAGNOSIS — I6523 Occlusion and stenosis of bilateral carotid arteries: Secondary | ICD-10-CM | POA: Diagnosis not present

## 2022-06-12 DIAGNOSIS — E785 Hyperlipidemia, unspecified: Secondary | ICD-10-CM

## 2022-06-12 NOTE — Patient Instructions (Signed)
Medication Instructions:  Your physician recommends that you continue on your current medications as directed. Please refer to the Current Medication list given to you today.  *If you need a refill on your cardiac medications before your next appointment, please call your pharmacy*   Lab Work: NONE   If you have labs (blood work) drawn today and your tests are completely normal, you will receive your results only by: [place] (if you have MyChart) OR A paper copy in the mail If you have any lab test that is abnormal or we need to change your treatment, we will call you to review the results.   Testing/Procedures: Your physician has requested that you have an echocardiogram. Echocardiography is a painless test that uses sound waves to create images of your heart. It provides your doctor with information about the size and shape of your heart and how well your heart's chambers and valves are working. This procedure takes approximately one hour. There are no restrictions for this procedure. Please do NOT wear cologne, perfume, aftershave, or lotions (deodorant is allowed). Please arrive 15 minutes prior to your appointment time.  Your physician has requested that you have a carotid duplex. This test is an ultrasound of the carotid arteries in your neck. It looks at blood flow through these arteries that supply the brain with blood. Allow one hour for this exam. There are no restrictions or special instructions.    Follow-Up: At [facility], you and your health needs are our priority.  As part of our continuing mission to provide you with exceptional heart care, we have created designated Provider Care Teams.  These Care Teams include your primary Cardiologist (physician) and Advanced Practice Providers (APPs -  Physician Assistants and Nurse Practitioners) who all work together to provide you with the care you need, when you need it.  We recommend signing up for the patient  portal called "MyChart".  Sign up information is provided on this After Visit Summary.  MyChart is used to connect with patients for Virtual Visits (Telemedicine).  Patients are able to view lab/test results, encounter notes, upcoming appointments, etc.  Non-urgent messages can be sent to your provider as well.   To learn more about what you can do with MyChart, go to [email or web address].    Your next appointment:   1 year(s)  The format for your next appointment:   In Person  Provider:   Dorris Carnes, MD    Other Instructions Thank you for choosing [facility]!   Two Gram Sodium Diet 2000 mg  What is Sodium? Sodium is a mineral found naturally in many foods. The most significant source of sodium in the diet is table salt, which is about 40% sodium.  Processed, convenience, and preserved foods also contain a large amount of sodium.  The body needs only 500 mg of sodium daily to function,  A normal diet provides more than enough sodium even if you do not use salt.  Why Limit Sodium? A build up of sodium in the body can cause thirst, increased blood pressure, shortness of breath, and water retention.  Decreasing sodium in the diet can reduce edema and risk of heart attack or stroke associated with high blood pressure.  Keep in mind that there are many other factors involved in these health problems.  Heredity, obesity, lack of exercise, cigarette smoking, stress and what you eat all play a role.  General Guidelines: Do not add salt at the table or in  cooking.  One teaspoon of salt contains over 2 grams of sodium. Read food labels Avoid processed and convenience foods Ask your dietitian before eating any foods not dicussed in the menu planning guidelines Consult your physician if you wish to use a salt substitute or a sodium containing medication such as antacids.  Limit milk and milk products to 16 oz (2 cups) per day.  Shopping Hints: READ LABELS!! "Dietetic" does not  necessarily mean low sodium. Salt and other sodium ingredients are often added to foods during processing.    Menu Planning Guidelines Food Group Choose More Often Avoid  Beverages (see also the milk group All fruit juices, low-sodium, salt-free vegetables juices, low-sodium carbonated beverages Regular vegetable or tomato juices, commercially softened water used for drinking or cooking  Breads and Cereals Enriched white, wheat, rye and pumpernickel bread, hard rolls and dinner rolls; muffins, cornbread and waffles; most dry cereals, cooked cereal without added salt; unsalted crackers and breadsticks; low sodium or homemade bread crumbs Bread, rolls and crackers with salted tops; quick breads; instant hot cereals; pancakes; commercial bread stuffing; self-rising flower and biscuit mixes; regular bread crumbs or cracker crumbs  Desserts and Sweets Desserts and sweets mad with mild should be within allowance Instant pudding mixes and cake mixes  Fats Butter or margarine; vegetable oils; unsalted salad dressings, regular salad dressings limited to 1 Tbs; light, sour and heavy cream Regular salad dressings containing bacon fat, bacon bits, and salt pork; snack dips made with instant soup mixes or processed cheese; salted nuts  Fruits Most fresh, frozen and canned fruits Fruits processed with salt or sodium-containing ingredient (some dried fruits are processed with sodium sulfites        Vegetables Fresh, frozen vegetables and low- sodium canned vegetables Regular canned vegetables, sauerkraut, pickled vegetables, and others prepared in brine; frozen vegetables in sauces; vegetables seasoned with ham, bacon or salt pork  Condiments, Sauces, Miscellaneous  Salt substitute with physician's approval; pepper, herbs, spices; vinegar, lemon or lime juice; hot pepper sauce; garlic powder, onion powder, low sodium soy sauce (1 Tbs.); low sodium condiments (ketchup, chili sauce, mustard) in limited amounts (1  tsp.) fresh ground horseradish; unsalted tortilla chips, pretzels, potato chips, popcorn, salsa (1/4 cup) Any seasoning made with salt including garlic salt, celery salt, onion salt, and seasoned salt; sea salt, rock salt, kosher salt; meat tenderizers; monosodium glutamate; mustard, regular soy sauce, barbecue, sauce, chili sauce, teriyaki sauce, steak sauce, Worcestershire sauce, and most flavored vinegars; canned gravy and mixes; regular condiments; salted snack foods, olives, picles, relish, horseradish sauce, catsup   Food preparation: Try these seasonings Meats:    Pork Sage, onion Serve with applesauce  Chicken Poultry seasoning, thyme, parsley Serve with cranberry sauce  Lamb Curry powder, rosemary, garlic, thyme Serve with mint sauce or jelly  Veal Marjoram, basil Serve with current jelly, cranberry sauce  Beef Pepper, bay leaf Serve with dry mustard, unsalted chive butter  Fish Bay leaf, dill Serve with unsalted lemon butter, unsalted parsley butter  Vegetables:    Asparagus Lemon juice   Broccoli Lemon juice   Carrots Mustard dressing parsley, mint, nutmeg, glazed with unsalted butter and sugar   Green beans Marjoram, lemon juice, nutmeg,dill seed   Tomatoes Basil, marjoram, onion   Spice /blend for [employer]" 4 tsp ground thyme 1 tsp ground sage 3 tsp ground rosemary 4 tsp ground marjoram   Test your knowledge A product that says "Salt Free" may still contain sodium. True or False Garlic Powder  and Hot Pepper Sauce an be used as alternative seasonings.True or False Processed foods have more sodium than fresh foods.  True or False Canned Vegetables have less sodium than froze True or False   WAYS TO DECREASE YOUR SODIUM INTAKE Avoid the use of added salt in cooking and at the table.  Table salt (and other prepared seasonings which contain salt) is probably one of the greatest sources of sodium in the diet.  Unsalted foods can gain flavor from the sweet, sour, and butter  taste sensations of herbs and spices.  Instead of using salt for seasoning, try the following seasonings with the foods listed.  Remember: how you use them to enhance natural food flavors is limited only by your creativity... Allspice-Meat, fish, eggs, fruit, peas, red and yellow vegetables Almond Extract-Fruit baked goods Anise Seed-Sweet breads, fruit, carrots, beets, cottage cheese, cookies (tastes like licorice) Basil-Meat, fish, eggs, vegetables, rice, vegetables salads, soups, sauces Bay Leaf-Meat, fish, stews, poultry Burnet-Salad, vegetables (cucumber-like flavor) Caraway Seed-Bread, cookies, cottage cheese, meat, vegetables, cheese, rice Cardamon-Baked goods, fruit, soups Celery Powder or seed-Salads, salad dressings, sauces, meatloaf, soup, bread.Do not use  celery salt Chervil-Meats, salads, fish, eggs, vegetables, cottage cheese (parsley-like flavor) Chili Power-Meatloaf, chicken cheese, corn, eggplant, egg dishes Chives-Salads cottage cheese, egg dishes, soups, vegetables, sauces Cilantro-Salsa, casseroles Cinnamon-Baked goods, fruit, pork, lamb, chicken, carrots Cloves-Fruit, baked goods, fish, pot roast, green beans, beets, carrots Coriander-Pastry, cookies, meat, salads, cheese (lemon-orange flavor) Cumin-Meatloaf, fish,cheese, eggs, cabbage,fruit pie (caraway flavor) [employer], fruit, eggs, fish, poultry, cottage cheese, vegetables Dill Seed-Meat, cottage cheese, poultry, vegetables, fish, salads, bread Fennel Seed-Bread, cookies, apples, pork, eggs, fish, beets, cabbage, cheese, Licorice-like flavor Garlic-(buds or powder) Salads, meat, poultry, fish, bread, butter, vegetables, potatoes.Do not  use garlic salt Ginger-Fruit, vegetables, baked goods, meat, fish, poultry Horseradish Root-Meet, vegetables, butter Lemon Juice or Extract-Vegetables, fruit, tea, baked goods, fish salads Mace-Baked goods fruit, vegetables, fish, poultry (taste like nutmeg) Maple  Extract-Syrups Marjoram-Meat, chicken, fish, vegetables, breads, green salads (taste like Sage) Mint-Tea, lamb, sherbet, vegetables, desserts, carrots, cabbage Mustard, Dry or Seed-Cheese, eggs, meats, vegetables, poultry Nutmeg-Baked goods, fruit, chicken, eggs, vegetables, desserts Onion Powder-Meat, fish, poultry, vegetables, cheese, eggs, bread, rice salads (Do not use   Onion salt) Orange Extract-Desserts, baked goods Oregano-Pasta, eggs, cheese, onions, pork, lamb, fish, chicken, vegetables, green salads Paprika-Meat, fish, poultry, eggs, cheese, vegetables Parsley Flakes-Butter, vegetables, meat fish, poultry, eggs, bread, salads (certain forms may   Contain sodium Pepper-Meat fish, poultry, vegetables, eggs Peppermint Extract-Desserts, baked goods Poppy Seed-Eggs, bread, cheese, fruit dressings, baked goods, noodles, vegetables, cottage  [employer], poultry, meat, fish, cauliflower, turnips,eggs bread Saffron-Rice, bread, veal, chicken, fish, eggs Sage-Meat, fish, poultry, onions, eggplant, tomateos, pork, stews Savory-Eggs, salads, poultry, meat, rice, vegetables, soups, pork Tarragon-Meat, poultry, fish, eggs, butter, vegetables (licorice-like flavor)  Thyme-Meat, poultry, fish, eggs, vegetables, (clover-like flavor), sauces, soups Tumeric-Salads, butter, eggs, fish, rice, vegetables (saffron-like flavor) Vanilla Extract-Baked goods, candy Vinegar-Salads, vegetables, meat marinades Walnut Extract-baked goods, candy   2. Choose your Foods Wisely   The following is a list of foods to avoid which are high in sodium:  Meats-Avoid all smoked, canned, salt cured, dried and kosher meat and fish as well as Anchovies   [employer] meats:Bologna, Liverwurst, Pastrami Canned meat or fish  Marinated herring Caviar    Pepperoni Corned Beef   Pizza Dried chipped beef  Salami Frozen breaded fish or meat Salt pork Frankfurters or  hot dogs  Sardines Gefilte  fish   Sausage Ham (boiled ham, Proscuitto Smoked butt    spiced ham)   Spam      TV Dinners Vegetables Canned vegetables (Regular) Relish Canned mushrooms  Sauerkraut Olives    Tomato juice Pickles  Bakery and Dessert Products Canned puddings  Cream pies Cheesecake   Decorated cakes Cookies  Beverages/Juices Tomato juice, regular  Gatorade   V-8 vegetable juice, regular  Breads and Cereals Biscuit mixes   Salted potato chips, corn chips, pretzels Bread stuffing mixes  Salted crackers and rolls Pancake and waffle mixes Self-rising flour  Seasonings Accent    Meat sauces Barbecue sauce  Meat tenderizer Catsup    Monosodium glutamate (MSG) Celery salt   Onion salt Chili sauce   Prepared mustard Garlic salt   Salt, seasoned salt, sea salt Gravy mixes   Soy sauce Horseradish   Steak sauce Ketchup   Tartar sauce Lite salt    Teriyaki sauce Marinade mixes   Worcestershire sauce  Others Baking powder   Cocoa and cocoa mixes Baking soda   Commercial casserole mixes Candy-caramels, chocolate  Dehydrated soups    Bars, fudge,nougats  Instant rice and pasta mixes Canned broth or soup  Maraschino cherries Cheese, aged and processed cheese and cheese spreads  Learning Assessment Quiz  Indicated T (for True) or F (for False) for each of the following statements:  _____ Fresh fruits and vegetables and unprocessed grains are generally low in sodium _____ Water may contain a considerable amount of sodium, depending on the source _____ You can always tell if a food is high in sodium by tasting it _____ Certain laxatives my be high in sodium and should be avoided unless prescribed   by a physician or pharmacist _____ Salt substitutes may be used freely by anyone on a sodium restricted diet _____ Sodium is present in table salt, food additives and as a natural component of   most foods _____ Table salt is approximately 90% sodium _____ Limiting sodium  intake may help prevent excess fluid accumulation in the body _____ On a sodium-restricted diet, seasonings such as bouillon soy sauce, and    cooking wine should be used in place of table salt _____ On an ingredient list, a product which lists monosodium glutamate as the first   ingredient is an appropriate food to include on a low sodium diet  Circle the best answer(s) to the following statements (Hint: there may be more than one correct answer)  11. On a low-sodium diet, some acceptable snack items are:    A. Olives  F. Bean dip   K. Grapefruit juice    B. Salted Pretzels G. Commercial Popcorn   L. Canned peaches    C. Carrot Sticks  H. Bouillon   M. Unsalted nuts   D. [place] fries  I. Peanut butter crackers N. Salami   E. Sweet pickles J. Tomato Juice   O. Pizza  12.  Seasonings that may be used freely on a reduced - sodium diet include   A. Lemon wedges F.Monosodium glutamate K. Celery seed    B.Soysauce   G. Pepper   L. Mustard powder   C. Sea salt  H. Cooking wine  M. Onion flakes   D. Vinegar  E. Prepared horseradish N. Salsa   E. Sage   J. Worcestershire sauce  O. Chutney   Important Information About Sugar

## 2022-06-13 ENCOUNTER — Telehealth: Payer: Self-pay | Admitting: Internal Medicine

## 2022-06-13 ENCOUNTER — Encounter: Payer: Self-pay | Admitting: Family Medicine

## 2022-06-13 MED ORDER — LOSARTAN POTASSIUM 25 MG PO TABS: 25.0000 mg | ORAL_TABLET | Freq: Two times a day (BID) | ORAL | 3 refills | Status: AC

## 2022-06-13 NOTE — Telephone Encounter (Signed)
Per Sharyn Lull:   Yes I just saw him 12/4 and it said he was on losartan 25 mg 2 daily so please verify. thanks   Pt is taking 25 mg bid... refill sent to his Pharmacy.

## 2022-06-13 NOTE — Telephone Encounter (Signed)
*  STAT* If patient is at the pharmacy, call can be transferred to refill team.   1. Which medications need to be refilled? (please list name of each medication and dose if known) losartan (COZAAR) 25 MG tablet   2. Which pharmacy/location (including street and city if local pharmacy) is medication to be sent to? [place], [place] [address]   3. Do they need a 30 day or 90 day supply? 90 day supply

## 2022-06-13 NOTE — Telephone Encounter (Signed)
Error

## 2022-06-13 NOTE — Telephone Encounter (Signed)
Per chart this patient has not had Losartan '25mg'$  since 03/12/2021, #30, 0 refills, sig: 1 tab PO BID.  Which would mean he should have run out of this medication in 15 days from that date, approximately 03/27/21. There have been no refills from anyone in our system since that time.  Please advise on refill request. Thank you!

## 2022-06-14 ENCOUNTER — Other Ambulatory Visit: Payer: Self-pay | Admitting: Internal Medicine

## 2022-06-15 ENCOUNTER — Telehealth: Payer: Self-pay | Admitting: Internal Medicine

## 2022-06-15 MED ORDER — EZETIMIBE 10 MG PO TABS: 10.0000 mg | ORAL_TABLET | Freq: Every day | ORAL | 3 refills | Status: DC

## 2022-06-15 NOTE — Telephone Encounter (Signed)
*  STAT* If patient is at the pharmacy, call can be transferred to refill team.   1. Which medications need to be refilled? (please list name of each medication and dose if known) Ezetimibe  2. Which pharmacy/location (including street and city if local pharmacy) is medication to be sent to?   [facility] [place]  3. Do they need a 30 day or 90 day supply? 30 days and refills

## 2022-06-15 NOTE — Telephone Encounter (Signed)
Completed.

## 2022-06-19 ENCOUNTER — Ambulatory Visit (HOSPITAL_COMMUNITY)
Admission: RE | Admit: 2022-06-19 | Discharge: 2022-06-19 | Disposition: A | Discharge status: Home / Self Care | Payer: Medicare HMO | Source: Ambulatory Visit | Attending: Physician Assistant | Admitting: Physician Assistant

## 2022-06-19 DIAGNOSIS — I350 Nonrheumatic aortic (valve) stenosis: Secondary | ICD-10-CM

## 2022-06-19 DIAGNOSIS — I358 Other nonrheumatic aortic valve disorders: Secondary | ICD-10-CM | POA: Diagnosis not present

## 2022-06-19 DIAGNOSIS — I6523 Occlusion and stenosis of bilateral carotid arteries: Secondary | ICD-10-CM | POA: Insufficient documentation

## 2022-06-19 LAB — ECHOCARDIOGRAM COMPLETE
AR max vel: 1.28 cm2
AV Area VTI: 1.39 cm2
AV Area mean vel: 1.1 cm2
AV Mean grad: 15 mmHg
AV Peak grad: 23.3 mmHg
Ao pk vel: 2.42 m/s
Area-P 1/2: 2.83 cm2
S' Lateral: 2.9 cm

## 2022-06-19 NOTE — Progress Notes (Signed)
*  PRELIMINARY RESULTS* Echocardiogram 2D Echocardiogram has been performed.  Samuel Germany 06/19/2022, 2:12 PM

## 2022-06-20 ENCOUNTER — Telehealth: Payer: Self-pay

## 2022-06-20 ENCOUNTER — Ambulatory Visit (HOSPITAL_COMMUNITY)
Admission: RE | Admit: 2022-06-20 | Discharge: 2022-06-20 | Disposition: A | Discharge status: Home / Self Care | Payer: Medicare HMO | Source: Ambulatory Visit | Attending: Physician Assistant | Admitting: Physician Assistant

## 2022-06-20 DIAGNOSIS — I6521 Occlusion and stenosis of right carotid artery: Secondary | ICD-10-CM

## 2022-06-20 DIAGNOSIS — I779 Disorder of arteries and arterioles, unspecified: Secondary | ICD-10-CM | POA: Diagnosis not present

## 2022-06-20 DIAGNOSIS — I358 Other nonrheumatic aortic valve disorders: Secondary | ICD-10-CM | POA: Diagnosis not present

## 2022-06-20 DIAGNOSIS — I6523 Occlusion and stenosis of bilateral carotid arteries: Secondary | ICD-10-CM | POA: Diagnosis not present

## 2022-06-20 NOTE — Telephone Encounter (Signed)
I spoke with wife as she states spouse cannot hear well. I discussed both echo and carotid US results with her. I placed a referral to [facility] and she will await their call.

## 2022-06-20 NOTE — Telephone Encounter (Signed)
-----   Message from Imogene Burn, [place] sent at 06/20/2022 12:02 PM EST ----- Ultrasound shows progression with severe right carotid stenosis. Please refer to vascular surgery ASAP. thanks

## 2022-06-23 ENCOUNTER — Other Ambulatory Visit: Payer: Self-pay | Admitting: Internal Medicine

## 2022-06-28 ENCOUNTER — Ambulatory Visit: Payer: Medicare HMO | Admitting: Vascular Surgery

## 2022-06-28 ENCOUNTER — Encounter: Payer: Self-pay | Admitting: Vascular Surgery

## 2022-06-28 VITALS — BP 144/73 | HR 61 | Temp 98.8°F | Ht 72.0 in | Wt 256.0 lb

## 2022-06-28 DIAGNOSIS — I6521 Occlusion and stenosis of right carotid artery: Secondary | ICD-10-CM | POA: Diagnosis not present

## 2022-06-28 NOTE — Progress Notes (Signed)
[facility] [place]  Patient name: Adam Banks MRN: [id number] DOB: 1934/08/25 Sex: male  REASON FOR CONSULT: Evaluation severe right carotid stenosis  HPI: Adam Banks is a 87 y.o. male, who is here today for evaluation.  He is here with his daughter-in-law.  He was found on duplex to have reportedly greater than 70% right internal carotid artery stenosis.  He is right-handed.  He has no history of prior amaurosis fugax, transient ischemic attack or stroke.  He does not have any history of coronary artery disease.  He remains quite active.  Past Medical History:  Diagnosis Date   Enlarged prostate 2014   History of urinary hesitancy    Hypertension     Family History  Problem Relation Age of Onset   Hodgkin's lymphoma Mother    Heart attack Father     SOCIAL HISTORY: Social History   Socioeconomic History   Marital status: Married    Spouse name: Not on file   Number of children: Not on file   Years of education: Not on file   Highest education level: Not on file  Occupational History   Not on file  Tobacco Use   Smoking status: Never   Smokeless tobacco: Never  Vaping Use   Vaping Use: Never used  Substance and Sexual Activity   Alcohol use: No   Drug use: No   Sexual activity: Not on file  Other Topics Concern   Not on file  Social History Narrative   Not on file   Social Determinants of Health   Financial Resource Strain: Not on file  Food Insecurity: Not on file  Transportation Needs: Not on file  Physical Activity: Not on file  Stress: Not on file  Social Connections: Not on file  Intimate Partner Violence: Not on file    Allergies  Allergen Reactions   Flomax [Tamsulosin Hcl] Other (See Comments)    Patient states that he passed out.    Current Outpatient Medications  Medication Sig Dispense Refill   alfuzosin (UROXATRAL) 10 MG 24 hr tablet Take 1 tablet (10 mg total) by mouth  every evening. 90 tablet 3   aspirin EC 81 MG EC tablet Take 1 tablet (81 mg total) by mouth daily. Swallow whole. 30 tablet 0   ezetimibe (ZETIA) 10 MG tablet Take 1 tablet (10 mg total) by mouth daily. 30 tablet 3   finasteride (PROSCAR) 5 MG tablet Take 1 tablet (5 mg total) by mouth daily. 90 tablet 3   fluticasone (FLONASE) 50 MCG/ACT nasal spray Place 1 spray into both nostrils 2 (two) times daily. 9.9 mL 0   furosemide (LASIX) 40 MG tablet Take 1 tablet by mouth once daily 30 tablet 15   losartan (COZAAR) 25 MG tablet Take 1 tablet (25 mg total) by mouth 2 (two) times daily. 180 tablet 3   naproxen sodium (ALEVE) 220 MG tablet Take 220 mg by mouth daily as needed (pain).     potassium chloride SA (KLOR-CON M) 20 MEQ tablet Take 1 tablet (20 mEq total) by mouth daily. 30 tablet 1   rosuvastatin (CRESTOR) 10 MG tablet Take 1 tablet (10 mg total) by mouth daily. 30 tablet 11   sodium chloride (OCEAN) 0.65 % SOLN nasal spray Place 1 spray into both nostrils as needed for congestion. 15 mL 0   No current facility-administered medications for this visit.    REVIEW OF SYSTEMS:  '[X]'$  denotes positive finding, '[ ]'$   denotes negative finding Cardiac  Comments:  Chest pain or chest pressure:    Shortness of breath upon exertion: x   Short of breath when lying flat:    Irregular heart rhythm:        Vascular    Pain in calf, thigh, or hip brought on by ambulation: x   Pain in feet at night that wakes you up from your sleep:     Blood clot in your veins:    Leg swelling:  x       Pulmonary    Oxygen at home:    Productive cough:     Wheezing:         Neurologic    Sudden weakness in arms or legs:     Sudden numbness in arms or legs:     Sudden onset of difficulty speaking or slurred speech:    Temporary loss of vision in one eye:     Problems with dizziness:  x       Gastrointestinal    Blood in stool:     Vomited blood:         Genitourinary    Burning when urinating:     Blood  in urine:        Psychiatric    Major depression:         Hematologic    Bleeding problems:    Problems with blood clotting too easily:        Skin    Rashes or ulcers:        Constitutional    Fever or chills:      PHYSICAL EXAM: Vitals:   06/28/22 1033 06/28/22 1035  BP: (!) 159/72 (!) 144/73  Pulse: 61   Temp: 98.8 F (37.1 C)   TempSrc: Temporal   SpO2: 97%   Weight: 256 lb (116.1 kg)   Height: 6' (1.829 m)     GENERAL: The patient is a well-nourished male, in no acute distress. The vital signs are documented above. CARDIOVASCULAR: Carotid arteries without bruits bilaterally.  2+ radial pulses bilaterally. PULMONARY: There is good air exchange  MUSCULOSKELETAL: There are no major deformities or cyanosis. NEUROLOGIC: No focal weakness or paresthesias are detected. SKIN: There are no ulcers or rashes noted. PSYCHIATRIC: The patient has a normal affect.  DATA:  Carotid duplex from [facility] radiology from 06/20/2022 was reviewed.  This was compared to study from 04/04/2021.  There has been a progression in the right carotid stenosis in the 70 to 99% range.  MEDICAL ISSUES: Asymptomatic severe stenosis right internal carotid artery.  The patient is right-handed.  I had a very long discussion with the patient and his daughter-in-law present.  Explained that if he does have a critical stenosis, this places him at a 5% risk of neurologic deficit per year.  This could be TIA or stroke.  I did explain the surgical treatment of critical carotid disease with carotid endarterectomy and the major concern for risk of 1 to 1-1/2% of stroke with surgery.  I also explained that we are not comfortable operating on the basis of duplex alone if we can determine the normal internal carotid distal to the stenosis at the bifurcation and if we can confirm critical level of stenosis.  His end-diastolic velocities on the [facility] imaging study suggest that this may be less than 80% stenosis.  We  will see him again at his convenience in the next 1 to 2 weeks for repeat imaging of his right  internal carotid artery and make final recommendations based on this.   Rosetta Posner, MD FACS [facility] [phone] Pager [phone]  Note: Portions of this report may have been transcribed using voice recognition software.  Every effort has been made to ensure accuracy; however, inadvertent computerized transcription errors may still be present.

## 2022-07-05 DIAGNOSIS — J069 Acute upper respiratory infection, unspecified: Secondary | ICD-10-CM | POA: Diagnosis not present

## 2022-07-05 DIAGNOSIS — U071 COVID-19: Secondary | ICD-10-CM | POA: Diagnosis not present

## 2022-07-12 ENCOUNTER — Other Ambulatory Visit: Payer: Self-pay

## 2022-07-12 DIAGNOSIS — I6521 Occlusion and stenosis of right carotid artery: Secondary | ICD-10-CM

## 2022-07-16 ENCOUNTER — Other Ambulatory Visit: Payer: Self-pay | Admitting: Internal Medicine

## 2022-07-19 ENCOUNTER — Ambulatory Visit: Payer: Medicare HMO | Admitting: Vascular Surgery

## 2022-07-19 ENCOUNTER — Other Ambulatory Visit: Payer: Self-pay

## 2022-07-19 ENCOUNTER — Encounter: Payer: Self-pay | Admitting: Vascular Surgery

## 2022-07-19 ENCOUNTER — Ambulatory Visit (INDEPENDENT_AMBULATORY_CARE_PROVIDER_SITE_OTHER): Payer: Medicare HMO

## 2022-07-19 VITALS — BP 143/58 | HR 58 | Temp 98.2°F | Ht 72.0 in | Wt 255.8 lb

## 2022-07-19 DIAGNOSIS — I6521 Occlusion and stenosis of right carotid artery: Secondary | ICD-10-CM

## 2022-07-19 NOTE — Progress Notes (Signed)
[facility] [place]  Patient name: Adam Banks MRN: [id number] DOB: 12/20/1934 Sex: male  REASON FOR VISIT: Follow-up right carotid stenosis  HPI: Adam Banks is a 88 y.o. male here today for follow-up of right carotid stenosis.  Saw him several weeks ago.  He is asymptomatic moderate to severe right carotid disease.  I recommended a repeat duplex to determine if he was indeed critical and a candidate for endarterectomy based on duplex alone.  He is here today with his wife.  He remains asymptomatic  Past Medical History:  Diagnosis Date   Enlarged prostate 2014   History of urinary hesitancy    Hypertension     Family History  Problem Relation Age of Onset   Hodgkin's lymphoma Mother    Heart attack Father     SOCIAL HISTORY: Social History   Tobacco Use   Smoking status: Never   Smokeless tobacco: Never  Substance Use Topics   Alcohol use: No    Allergies  Allergen Reactions   Flomax [Tamsulosin Hcl] Other (See Comments)    Patient states that he passed out.    Current Outpatient Medications  Medication Sig Dispense Refill   alfuzosin (UROXATRAL) 10 MG 24 hr tablet Take 1 tablet (10 mg total) by mouth every evening. 90 tablet 3   aspirin EC 81 MG EC tablet Take 1 tablet (81 mg total) by mouth daily. Swallow whole. 30 tablet 0   ezetimibe (ZETIA) 10 MG tablet Take 1 tablet (10 mg total) by mouth daily. 30 tablet 3   finasteride (PROSCAR) 5 MG tablet Take 1 tablet (5 mg total) by mouth daily. 90 tablet 3   fluticasone (FLONASE) 50 MCG/ACT nasal spray Place 1 spray into both nostrils 2 (two) times daily. 9.9 mL 0   furosemide (LASIX) 40 MG tablet Take 1 tablet by mouth once daily 30 tablet 15   losartan (COZAAR) 25 MG tablet Take 1 tablet (25 mg total) by mouth 2 (two) times daily. 180 tablet 3   naproxen sodium (ALEVE) 220 MG tablet Take 220 mg by mouth daily as needed (pain).     potassium chloride  SA (KLOR-CON M) 20 MEQ tablet Take 1 tablet by mouth once daily 90 tablet 1   rosuvastatin (CRESTOR) 10 MG tablet Take 1 tablet by mouth once daily 90 tablet 1   sodium chloride (OCEAN) 0.65 % SOLN nasal spray Place 1 spray into both nostrils as needed for congestion. 15 mL 0   No current facility-administered medications for this visit.    REVIEW OF SYSTEMS:  '[X]'$  denotes positive finding, '[ ]'$  denotes negative finding Cardiac  Comments:  Chest pain or chest pressure:    Shortness of breath upon exertion:    Short of breath when lying flat:    Irregular heart rhythm:        Vascular    Pain in calf, thigh, or hip brought on by ambulation:    Pain in feet at night that wakes you up from your sleep:     Blood clot in your veins:    Leg swelling:           PHYSICAL EXAM: Vitals:   07/19/22 1546  BP: (!) 143/58  Pulse: (!) 58  Temp: 98.2 F (36.8 C)  SpO2: 96%  Weight: 255 lb 12.8 oz (116 kg)  Height: 6' (1.829 m)    GENERAL: The patient is a well-nourished male, in no acute distress. The vital signs  are documented above.  DATA:  Carotid duplex today reveals 60 to 79% stenosis.  His end-diastolic velocity is 64 cm/s  MEDICAL ISSUES: Moderate to severe asymptomatic right internal carotid artery stenosis.  I again reviewed symptoms of carotid disease with him.  Knows to report immediately to the emergency room should he develop neurologic deficit.  Otherwise we will see him in 6 months with repeat bilateral carotid duplex    Rosetta Posner, MD FACS [facility] [phone]  Note: Portions of this report may have been transcribed using voice recognition software.  Every effort has been made to ensure accuracy; however, inadvertent computerized transcription errors may still be present.

## 2022-09-18 ENCOUNTER — Other Ambulatory Visit: Payer: Self-pay | Admitting: *Deleted

## 2022-09-18 MED ORDER — ROSUVASTATIN CALCIUM 10 MG PO TABS: 10.0000 mg | ORAL_TABLET | Freq: Every day | ORAL | 1 refills | Status: DC

## 2022-09-20 DIAGNOSIS — I10 Essential (primary) hypertension: Secondary | ICD-10-CM | POA: Diagnosis not present

## 2022-09-20 DIAGNOSIS — E1122 Type 2 diabetes mellitus with diabetic chronic kidney disease: Secondary | ICD-10-CM | POA: Diagnosis not present

## 2022-09-25 DIAGNOSIS — Z0000 Encounter for general adult medical examination without abnormal findings: Secondary | ICD-10-CM | POA: Diagnosis not present

## 2022-09-26 DIAGNOSIS — R600 Localized edema: Secondary | ICD-10-CM | POA: Diagnosis not present

## 2022-09-26 DIAGNOSIS — M5441 Lumbago with sciatica, right side: Secondary | ICD-10-CM | POA: Diagnosis not present

## 2022-09-26 DIAGNOSIS — N182 Chronic kidney disease, stage 2 (mild): Secondary | ICD-10-CM | POA: Diagnosis not present

## 2022-09-26 DIAGNOSIS — I130 Hypertensive heart and chronic kidney disease with heart failure and stage 1 through stage 4 chronic kidney disease, or unspecified chronic kidney disease: Secondary | ICD-10-CM | POA: Diagnosis not present

## 2022-09-26 DIAGNOSIS — E785 Hyperlipidemia, unspecified: Secondary | ICD-10-CM | POA: Diagnosis not present

## 2022-09-26 DIAGNOSIS — I5033 Acute on chronic diastolic (congestive) heart failure: Secondary | ICD-10-CM | POA: Diagnosis not present

## 2022-09-26 DIAGNOSIS — N400 Enlarged prostate without lower urinary tract symptoms: Secondary | ICD-10-CM | POA: Diagnosis not present

## 2022-09-26 DIAGNOSIS — E1122 Type 2 diabetes mellitus with diabetic chronic kidney disease: Secondary | ICD-10-CM | POA: Diagnosis not present

## 2022-09-26 DIAGNOSIS — I10 Essential (primary) hypertension: Secondary | ICD-10-CM | POA: Diagnosis not present

## 2022-10-19 DIAGNOSIS — E1122 Type 2 diabetes mellitus with diabetic chronic kidney disease: Secondary | ICD-10-CM | POA: Diagnosis not present

## 2022-10-19 DIAGNOSIS — R42 Dizziness and giddiness: Secondary | ICD-10-CM | POA: Diagnosis not present

## 2022-10-19 DIAGNOSIS — E1169 Type 2 diabetes mellitus with other specified complication: Secondary | ICD-10-CM | POA: Diagnosis not present

## 2022-10-19 DIAGNOSIS — I5033 Acute on chronic diastolic (congestive) heart failure: Secondary | ICD-10-CM | POA: Diagnosis not present

## 2022-10-19 DIAGNOSIS — N182 Chronic kidney disease, stage 2 (mild): Secondary | ICD-10-CM | POA: Diagnosis not present

## 2022-10-19 DIAGNOSIS — I130 Hypertensive heart and chronic kidney disease with heart failure and stage 1 through stage 4 chronic kidney disease, or unspecified chronic kidney disease: Secondary | ICD-10-CM | POA: Diagnosis not present

## 2022-10-19 DIAGNOSIS — R600 Localized edema: Secondary | ICD-10-CM | POA: Diagnosis not present

## 2022-10-19 DIAGNOSIS — I10 Essential (primary) hypertension: Secondary | ICD-10-CM | POA: Diagnosis not present

## 2022-10-19 DIAGNOSIS — E785 Hyperlipidemia, unspecified: Secondary | ICD-10-CM | POA: Diagnosis not present

## 2022-10-20 ENCOUNTER — Other Ambulatory Visit: Payer: Self-pay | Admitting: Internal Medicine

## 2022-11-22 DIAGNOSIS — Z713 Dietary counseling and surveillance: Secondary | ICD-10-CM | POA: Diagnosis not present

## 2022-11-22 DIAGNOSIS — K1120 Sialoadenitis, unspecified: Secondary | ICD-10-CM | POA: Diagnosis not present

## 2022-11-22 DIAGNOSIS — Z6837 Body mass index (BMI) 37.0-37.9, adult: Secondary | ICD-10-CM | POA: Diagnosis not present

## 2023-01-23 ENCOUNTER — Other Ambulatory Visit: Payer: Self-pay

## 2023-01-23 MED ORDER — POTASSIUM CHLORIDE CRYS ER 20 MEQ PO TBCR: 20.0000 meq | EXTENDED_RELEASE_TABLET | Freq: Every day | ORAL | 1 refills | Status: DC

## 2023-01-26 DIAGNOSIS — I10 Essential (primary) hypertension: Secondary | ICD-10-CM | POA: Diagnosis not present

## 2023-01-26 DIAGNOSIS — E1122 Type 2 diabetes mellitus with diabetic chronic kidney disease: Secondary | ICD-10-CM | POA: Diagnosis not present

## 2023-02-01 DIAGNOSIS — I130 Hypertensive heart and chronic kidney disease with heart failure and stage 1 through stage 4 chronic kidney disease, or unspecified chronic kidney disease: Secondary | ICD-10-CM | POA: Diagnosis not present

## 2023-02-01 DIAGNOSIS — E785 Hyperlipidemia, unspecified: Secondary | ICD-10-CM | POA: Diagnosis not present

## 2023-02-01 DIAGNOSIS — N182 Chronic kidney disease, stage 2 (mild): Secondary | ICD-10-CM | POA: Diagnosis not present

## 2023-02-01 DIAGNOSIS — R600 Localized edema: Secondary | ICD-10-CM | POA: Diagnosis not present

## 2023-02-01 DIAGNOSIS — I10 Essential (primary) hypertension: Secondary | ICD-10-CM | POA: Diagnosis not present

## 2023-02-01 DIAGNOSIS — M5441 Lumbago with sciatica, right side: Secondary | ICD-10-CM | POA: Diagnosis not present

## 2023-02-01 DIAGNOSIS — I5033 Acute on chronic diastolic (congestive) heart failure: Secondary | ICD-10-CM | POA: Diagnosis not present

## 2023-02-01 DIAGNOSIS — E1122 Type 2 diabetes mellitus with diabetic chronic kidney disease: Secondary | ICD-10-CM | POA: Diagnosis not present

## 2023-02-01 DIAGNOSIS — N400 Enlarged prostate without lower urinary tract symptoms: Secondary | ICD-10-CM | POA: Diagnosis not present

## 2023-03-20 DIAGNOSIS — L570 Actinic keratosis: Secondary | ICD-10-CM | POA: Diagnosis not present

## 2023-03-20 DIAGNOSIS — L821 Other seborrheic keratosis: Secondary | ICD-10-CM | POA: Diagnosis not present

## 2023-03-20 DIAGNOSIS — X32XXXD Exposure to sunlight, subsequent encounter: Secondary | ICD-10-CM | POA: Diagnosis not present

## 2023-04-04 ENCOUNTER — Other Ambulatory Visit: Payer: Self-pay | Admitting: *Deleted

## 2023-04-04 DIAGNOSIS — I6521 Occlusion and stenosis of right carotid artery: Secondary | ICD-10-CM

## 2023-04-11 ENCOUNTER — Ambulatory Visit (HOSPITAL_COMMUNITY)
Admission: RE | Admit: 2023-04-11 | Discharge: 2023-04-11 | Disposition: A | Discharge status: Home / Self Care | Payer: Medicare HMO | Source: Ambulatory Visit | Attending: Vascular Surgery | Admitting: Vascular Surgery

## 2023-04-11 ENCOUNTER — Ambulatory Visit: Payer: Medicare HMO | Admitting: Vascular Surgery

## 2023-04-11 ENCOUNTER — Encounter: Payer: Self-pay | Admitting: Vascular Surgery

## 2023-04-11 VITALS — BP 164/70 | HR 53 | Temp 98.1°F | Resp 20 | Ht 72.0 in | Wt 255.0 lb

## 2023-04-11 DIAGNOSIS — I6521 Occlusion and stenosis of right carotid artery: Secondary | ICD-10-CM

## 2023-04-11 NOTE — Progress Notes (Signed)
Patient ID: Adam Banks, male   DOB: 19-Feb-1935, 88 y.o.   MRN: [id number]  Reason for Consult: Follow-up   Referred by Benita Stabile, MD  Subjective:     HPI:  Adam Banks is a 88 y.o. male initially evaluated in [place] for asymptomatic right ICA stenosis.  This has been asymptomatic.  He remains on aspirin and Crestor.  He is a lifelong non-smoker.  Denies any history of stroke, TIA or amaurosis.  Limitation to activity his shortness of breath after having pneumonia last year but is able to walk without claudication denies tissue loss or ulceration.  Past Medical History:  Diagnosis Date   Enlarged prostate 2014   History of urinary hesitancy    Hypertension    Family History  Problem Relation Age of Onset   Hodgkin's lymphoma Mother    Heart attack Father    Past Surgical History:  Procedure Laterality Date   HYDROCELE EXCISION Left 05/05/2014   Procedure: LEFT HYDROCELECTOMY ADULT;  Surgeon: Chelsea Aus, MD;  Location: [facility];  Service: Urology;  Laterality: Left;   INGUINAL HERNIA REPAIR Right 1977   TONSILLECTOMY      Short Social History:  Social History   Tobacco Use   Smoking status: Never   Smokeless tobacco: Never  Substance Use Topics   Alcohol use: No    Allergies  Allergen Reactions   Flomax [Tamsulosin Hcl] Other (See Comments)    Patient states that he passed out.    Current Outpatient Medications  Medication Sig Dispense Refill   alfuzosin (UROXATRAL) 10 MG 24 hr tablet Take 1 tablet (10 mg total) by mouth every evening. 90 tablet 3   aspirin EC 81 MG EC tablet Take 1 tablet (81 mg total) by mouth daily. Swallow whole. 30 tablet 0   ezetimibe (ZETIA) 10 MG tablet Take 1 tablet by mouth once daily 90 tablet 1   finasteride (PROSCAR) 5 MG tablet Take 1 tablet (5 mg total) by mouth daily. 90 tablet 3   fluticasone (FLONASE) 50 MCG/ACT nasal spray Place 1 spray into both nostrils 2 (two) times daily. 9.9 mL 0   furosemide  (LASIX) 40 MG tablet Take 1 tablet by mouth once daily 30 tablet 15   losartan (COZAAR) 25 MG tablet Take 1 tablet (25 mg total) by mouth 2 (two) times daily. 180 tablet 3   naproxen sodium (ALEVE) 220 MG tablet Take 220 mg by mouth daily as needed (pain).     potassium chloride SA (KLOR-CON M) 20 MEQ tablet Take 1 tablet (20 mEq total) by mouth daily. 90 tablet 1   rosuvastatin (CRESTOR) 10 MG tablet Take 1 tablet (10 mg total) by mouth daily. 90 tablet 1   sodium chloride (OCEAN) 0.65 % SOLN nasal spray Place 1 spray into both nostrils as needed for congestion. 15 mL 0   No current facility-administered medications for this visit.    Review of Systems  Constitutional:  Constitutional negative. HENT: HENT negative.  Eyes: Eyes negative.  Respiratory: Positive for shortness of breath.  Cardiovascular: Cardiovascular negative.  GI: Gastrointestinal negative.  Musculoskeletal: Musculoskeletal negative.  Skin: Skin negative.  Neurological: Neurological negative. Hematologic: Hematologic/lymphatic negative.  Psychiatric: Psychiatric negative.        Objective:  Objective   Vitals:   04/11/23 1345 04/11/23 1347  BP: (!) 162/76 (!) 164/70  Pulse: (!) 53   Resp: 20   Temp: 98.1 F (36.7 C)   SpO2: 97%   Weight:  255 lb (115.7 kg)   Height: 6' (1.829 m)    Body mass index is 34.58 kg/m.  Physical Exam HENT:     Nose: Nose normal.  Eyes:     Pupils: Pupils are equal, round, and reactive to light.  Neck:     Vascular: Carotid bruit present.  Cardiovascular:     Heart sounds: Murmur heard.  Pulmonary:     Effort: Pulmonary effort is normal.  Abdominal:     General: Abdomen is flat.     Palpations: Abdomen is soft.  Musculoskeletal:     Right lower leg: No edema.     Left lower leg: No edema.  Skin:    General: Skin is warm.     Capillary Refill: Capillary refill takes less than 2 seconds.  Neurological:     General: No focal deficit present.     Mental Status: He  is alert.  Psychiatric:        Mood and Affect: Mood normal.        Thought Content: Thought content normal.        Judgment: Judgment normal.    Data: Right Carotid Findings:  +----------+--------+--------+--------+-------------------------------+----  ----+           PSV cm/sEDV cm/sStenosisPlaque Description              Comments  +----------+--------+--------+--------+-------------------------------+----  ----+  CCA Prox  80      10                                                        +----------+--------+--------+--------+-------------------------------+----  ----+  CCA Mid   62      11              heterogenous                              +----------+--------+--------+--------+-------------------------------+----  ----+  CCA Distal61      10              calcific                                  +----------+--------+--------+--------+-------------------------------+----  ----+  ICA Prox  314     72      60-79%  heterogenous and hypoechoic               +----------+--------+--------+--------+-------------------------------+----  ----+  ICA Mid   107     23                                                        +----------+--------+--------+--------+-------------------------------+----  ----+  ICA Distal59      16                                                        +----------+--------+--------+--------+-------------------------------+----  ----+  ECA  255     0       >50%    hypoechoic, heterogenous and                                                calcific                                  +----------+--------+--------+--------+-------------------------------+----  ----+   +----------+--------+-------+----------------+-------------------+           PSV cm/sEDV cmsDescribe        Arm Pressure (mmHG)  +----------+--------+-------+----------------+-------------------+  [id number]     0      Multiphasic, [id number]                  +----------+--------+-------+----------------+-------------------+   +---------+--------+--+--------+-+---------+  VertebralPSV cm/s83EDV cm/s9Antegrade  +---------+--------+--+--------+-+---------+      Left Carotid Findings:  +----------+--------+--------+--------+-------------------------+--------+           PSV cm/sEDV cm/sStenosisPlaque Description       Comments  +----------+--------+--------+--------+-------------------------+--------+  CCA Prox  87      16              heterogenous                       +----------+--------+--------+--------+-------------------------+--------+  CCA Mid   91      16              heterogenous                       +----------+--------+--------+--------+-------------------------+--------+  CCA Distal80      15              heterogenous                       +----------+--------+--------+--------+-------------------------+--------+  ICA Prox  64      16      1-39%   heterogenous and calcific          +----------+--------+--------+--------+-------------------------+--------+  ICA Mid   81      15                                                 +----------+--------+--------+--------+-------------------------+--------+  ICA Distal71      23                                                 +----------+--------+--------+--------+-------------------------+--------+  ECA      100     0               heterogenous                       +----------+--------+--------+--------+-------------------------+--------+   +----------+--------+--------+----------------+-------------------+           PSV cm/sEDV cm/sDescribe        Arm Pressure (mmHG)  +----------+--------+--------+----------------+-------------------+  Subclavian140            Multiphasic, [id number]                  +----------+--------+--------+----------------+-------------------+    +---------+--------+--+--------+--+---------+  VertebralPSV cm/s69EDV cm/s12Antegrade  +---------+--------+--+--------+--+---------+         Summary:  Right Carotid: Velocities in the right ICA are consistent with a 60-79%                 stenosis. The ECA appears >50% stenosed.   Left Carotid: Velocities in the left ICA are consistent with a 1-39%  stenosis.   Vertebrals: Bilateral vertebral arteries demonstrate antegrade flow.  Subclavians: Normal flow hemodynamics were seen in bilateral subclavian               arteries.       Assessment/Plan:     88 year old male with asymptomatic moderate grade right ICA stenosis with similar end-diastolic velocities at his previous.  As such he is really at the low end of this range I think he can have yearly follow-up.  We discussed the signs and symptoms of stroke and the need for continued maximal medical therapy and he demonstrates good understanding and short of any strokelike symptoms will follow-up in 1 year.     Maeola Harman MD [facility]

## 2023-04-17 ENCOUNTER — Other Ambulatory Visit: Payer: Self-pay | Admitting: Internal Medicine

## 2023-04-30 NOTE — Progress Notes (Signed)
History of Present Illness: 88 yo male here for followup of BPH w/ sx's. On dual medical therapy--finasteride, alfuzosin.  He halso has a h/o hydrocelectomy.  Past Medical History:  Diagnosis Date   Enlarged prostate 2014   History of urinary hesitancy    Hypertension     Past Surgical History:  Procedure Laterality Date   HYDROCELE EXCISION Left 05/05/2014   Procedure: LEFT HYDROCELECTOMY ADULT;  Surgeon: Chelsea Aus, MD;  Location: [facility];  Service: Urology;  Laterality: Left;   INGUINAL HERNIA REPAIR Right 1977   TONSILLECTOMY      Home Medications:  Allergies as of 05/01/2023       Reactions   Flomax [tamsulosin Hcl] Other (See Comments)   Patient states that he passed out.        Medication List        Accurate as of April 30, 2023  7:19 PM. If you have any questions, ask your nurse or doctor.          alfuzosin 10 MG 24 hr tablet Commonly known as: UROXATRAL Take 1 tablet (10 mg total) by mouth every evening.   aspirin EC 81 MG tablet Take 1 tablet (81 mg total) by mouth daily. Swallow whole.   ezetimibe 10 MG tablet Commonly known as: ZETIA Take 1 tablet by mouth once daily   finasteride 5 MG tablet Commonly known as: PROSCAR Take 1 tablet (5 mg total) by mouth daily.   fluticasone 50 MCG/ACT nasal spray Commonly known as: FLONASE Place 1 spray into both nostrils 2 (two) times daily.   furosemide 40 MG tablet Commonly known as: LASIX Take 1 tablet by mouth once daily   losartan 25 MG tablet Commonly known as: COZAAR Take 1 tablet (25 mg total) by mouth 2 (two) times daily.   naproxen sodium 220 MG tablet Commonly known as: ALEVE Take 220 mg by mouth daily as needed (pain).   potassium chloride SA 20 MEQ tablet Commonly known as: KLOR-CON M Take 1 tablet (20 mEq total) by mouth daily.   rosuvastatin 10 MG tablet Commonly known as: CRESTOR Take 1 tablet (10 mg total) by mouth daily.   sodium chloride 0.65 % Soln nasal  spray Commonly known as: OCEAN Place 1 spray into both nostrils as needed for congestion.        Allergies:  Allergies  Allergen Reactions   Flomax [Tamsulosin Hcl] Other (See Comments)    Patient states that he passed out.    Family History  Problem Relation Age of Onset   Hodgkin's lymphoma Mother    Heart attack Father     Social History:  reports that he has never smoked. He has never used smokeless tobacco. He reports that he does not drink alcohol and does not use drugs.  ROS: A complete review of systems was performed.  All systems are negative except for pertinent findings as noted.  Physical Exam:  Vital signs in last 24 hours: There were no vitals taken for this visit. Constitutional:  Alert and oriented, No acute distress Cardiovascular: Regular rate  Respiratory: Normal respiratory effort GI: Abdomen is soft, nontender, nondistended, no abdominal masses. No CVAT.  Genitourinary: Normal male phallus, testes are descended bilaterally and non-tender and without masses, scrotum is normal in appearance without lesions or masses, perineum is normal on inspection. Lymphatic: No lymphadenopathy Neurologic: Grossly intact, no focal deficits Psychiatric: Normal mood and affect  I have reviewed prior pt notes  I have reviewed notes from referring/previous  physicians  I have reviewed urinalysis results  I have independently reviewed prior imaging  I have reviewed prior PSA results  I have reviewed prior urine culture   Impression/Assessment:  ***  Plan:  ***

## 2023-05-01 ENCOUNTER — Encounter: Payer: Self-pay | Admitting: Urology

## 2023-05-01 ENCOUNTER — Ambulatory Visit: Payer: Medicare HMO | Admitting: Urology

## 2023-05-01 VITALS — BP 160/69 | HR 71

## 2023-05-01 DIAGNOSIS — N401 Enlarged prostate with lower urinary tract symptoms: Secondary | ICD-10-CM

## 2023-05-01 DIAGNOSIS — R001 Bradycardia, unspecified: Secondary | ICD-10-CM | POA: Diagnosis not present

## 2023-05-01 DIAGNOSIS — R351 Nocturia: Secondary | ICD-10-CM | POA: Diagnosis not present

## 2023-05-01 DIAGNOSIS — N400 Enlarged prostate without lower urinary tract symptoms: Secondary | ICD-10-CM

## 2023-05-01 DIAGNOSIS — R0600 Dyspnea, unspecified: Secondary | ICD-10-CM | POA: Diagnosis not present

## 2023-05-01 DIAGNOSIS — R350 Frequency of micturition: Secondary | ICD-10-CM | POA: Diagnosis not present

## 2023-05-01 DIAGNOSIS — R55 Syncope and collapse: Secondary | ICD-10-CM | POA: Diagnosis not present

## 2023-05-01 LAB — MICROSCOPIC EXAMINATION
Bacteria, UA: NONE SEEN
WBC, UA: NONE SEEN /[HPF] (ref 0–5)

## 2023-05-01 LAB — URINALYSIS, ROUTINE W REFLEX MICROSCOPIC
Bilirubin, UA: NEGATIVE
Glucose, UA: NEGATIVE
Ketones, UA: NEGATIVE
Leukocytes,UA: NEGATIVE
Nitrite, UA: NEGATIVE
Protein,UA: NEGATIVE
Specific Gravity, UA: 1.02 (ref 1.005–1.030)
Urobilinogen, Ur: 0.2 mg/dL (ref 0.2–1.0)
pH, UA: 6.5 (ref 5.0–7.5)

## 2023-05-01 LAB — BLADDER SCAN AMB NON-IMAGING: Scan Result: 119

## 2023-05-01 MED ORDER — FINASTERIDE 5 MG PO TABS: 5.0000 mg | ORAL_TABLET | Freq: Every day | ORAL | 3 refills | Status: DC

## 2023-05-01 MED ORDER — ALFUZOSIN HCL ER 10 MG PO TB24: 10.0000 mg | ORAL_TABLET | Freq: Every evening | ORAL | 3 refills | Status: DC

## 2023-05-05 IMAGING — US US CAROTID DUPLEX BILAT
1 series · 13 of 24 positions shown · non-contrast
Comparison: None.

CLINICAL DATA: 86-year-old male with carotid atherosclerosis

EXAM:
BILATERAL CAROTID DUPLEX ULTRASOUND
TECHNIQUE: Gray scale imaging, color Doppler and duplex ultrasound were
performed of bilateral carotid and vertebral arteries in the neck.

[Series 1: us carotid bilateral · 13 of 69 slices shown]
[im 1/69]
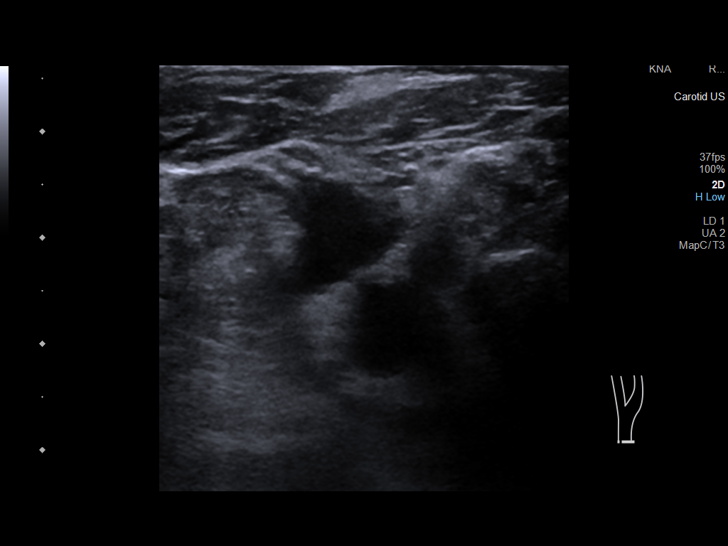
[im 6/69]
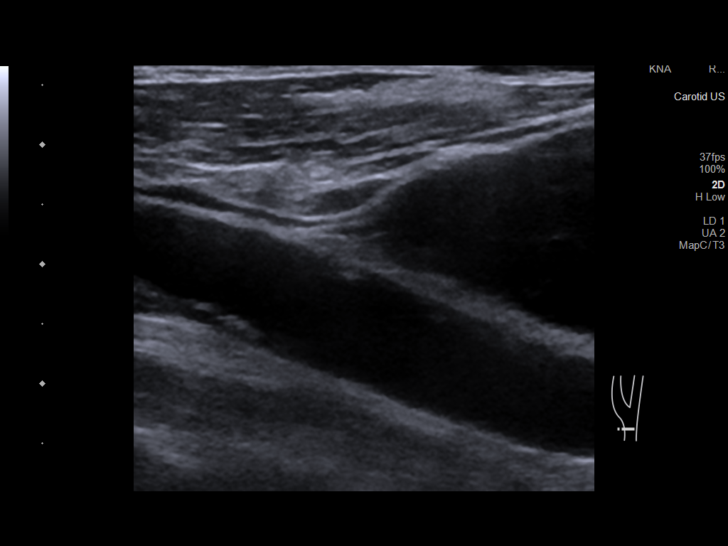
[im 12/69]
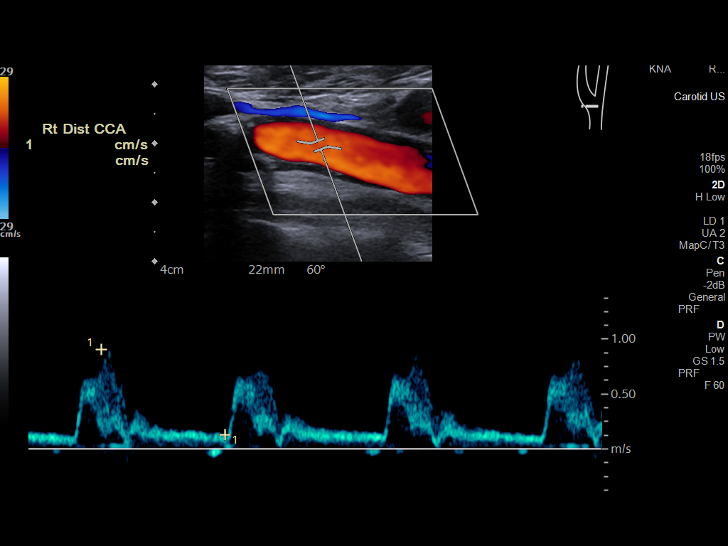
[im 18/69]
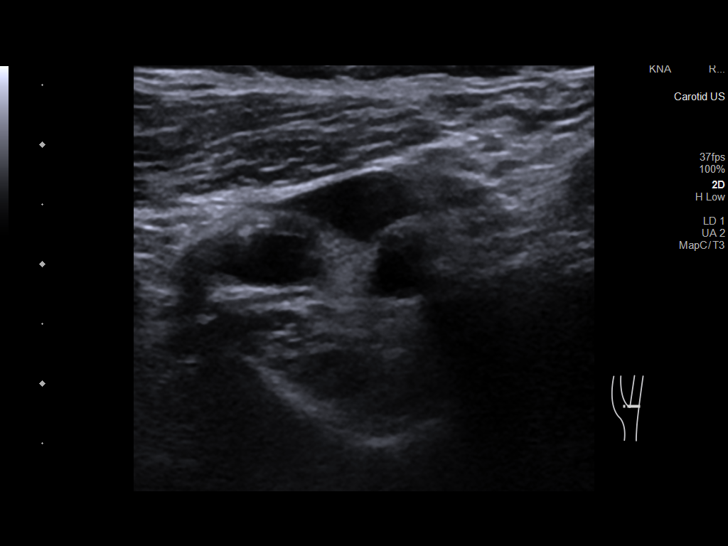
[im 24/69]
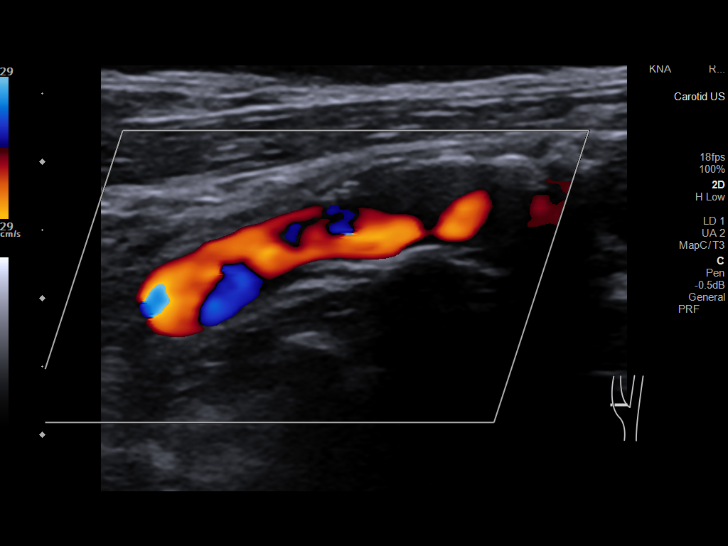
[im 30/69]
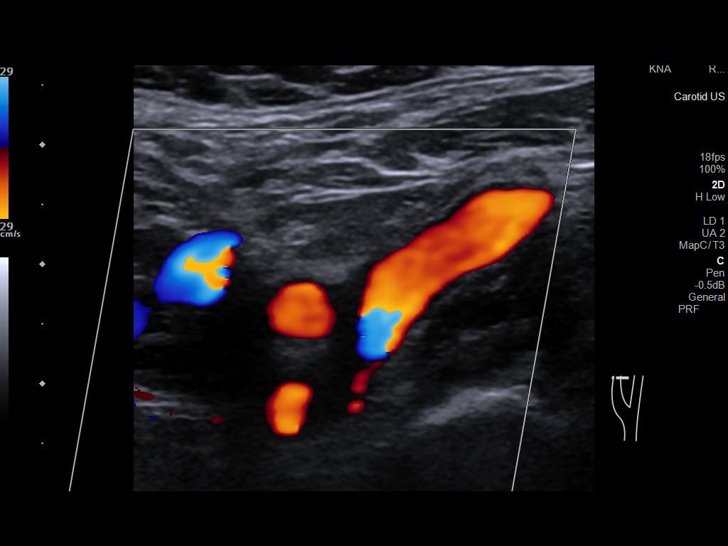
[im 36/69]
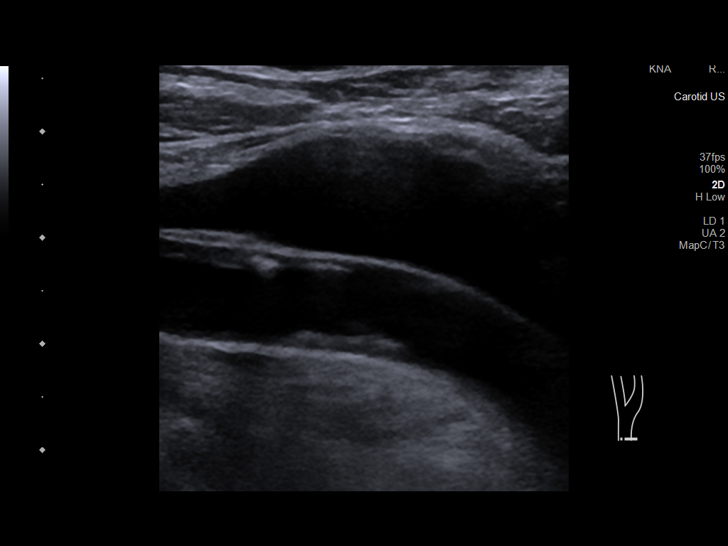
[im 39/69]
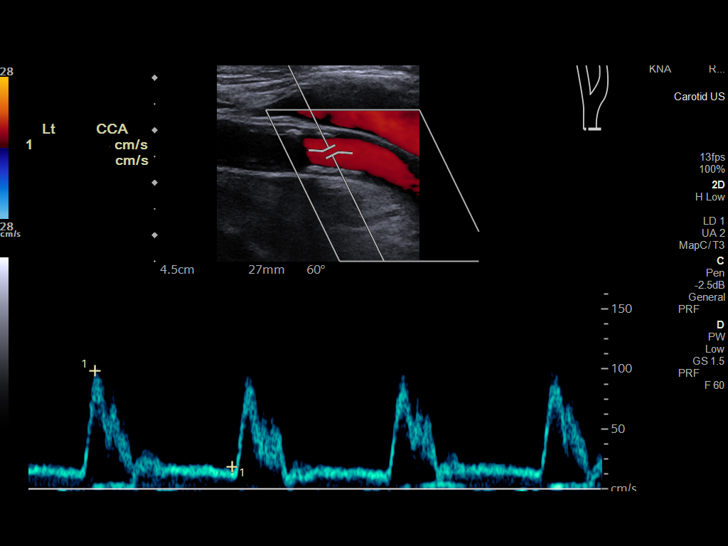
[im 45/69]
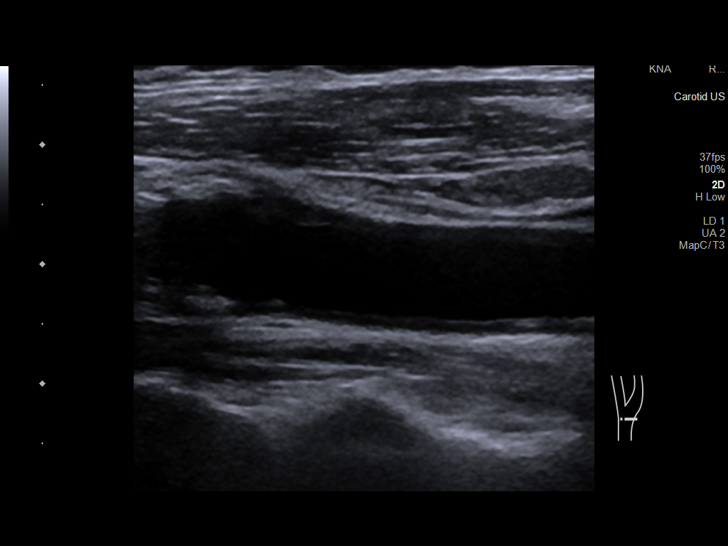
[im 51/69]
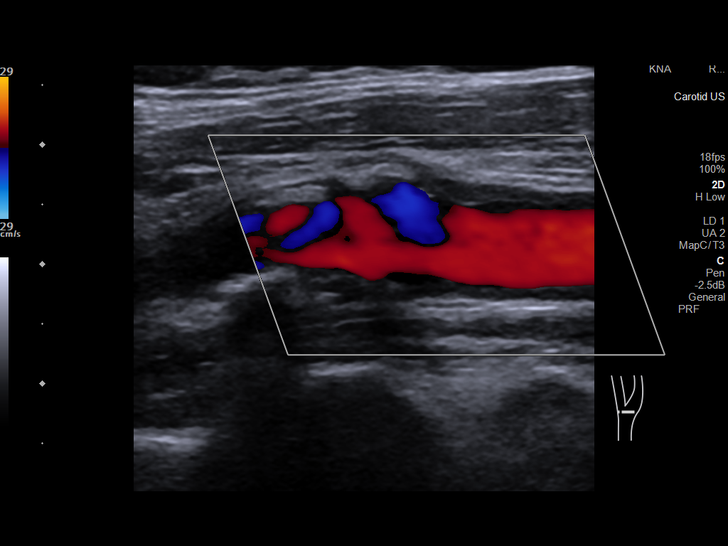
[im 57/69]
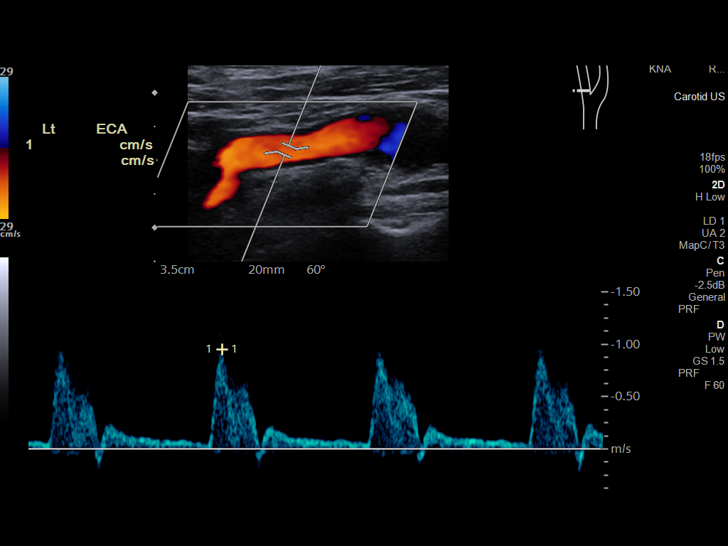
[im 63/69]
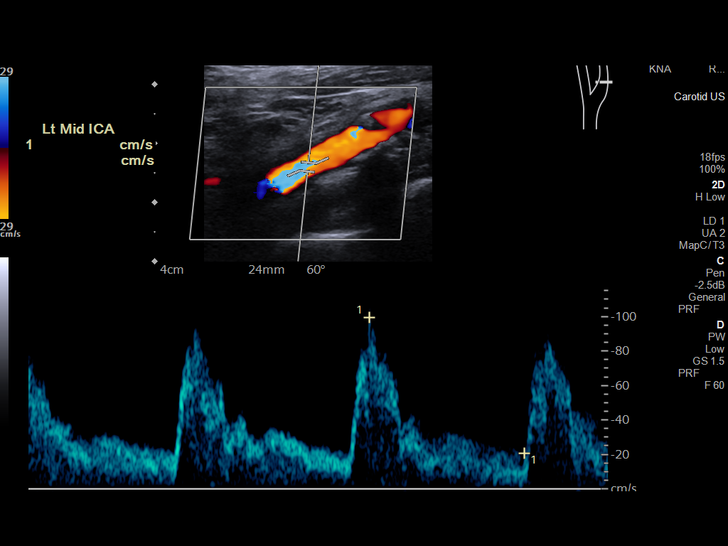
[im 69/69]
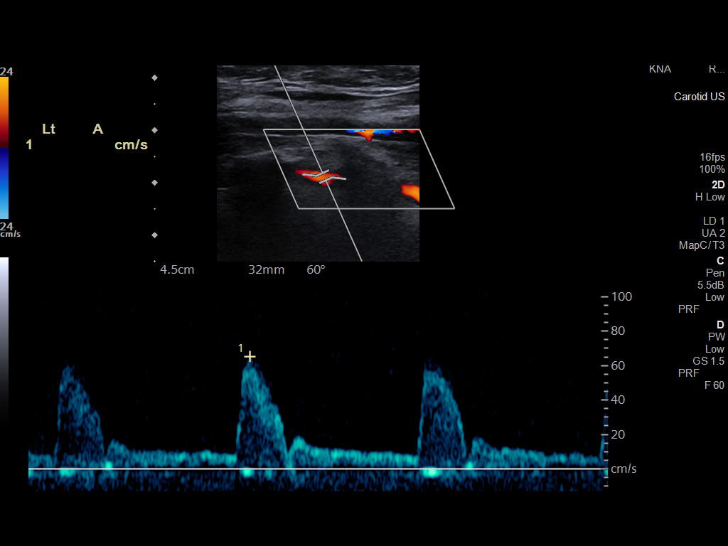

[13 of 24 positions shown; findings below may reference images not displayed]

FINDINGS: Criteria: Quantification of carotid stenosis is based on velocity
parameters that correlate the residual internal carotid diameter
with NASCET-based stenosis levels, using the diameter of the distal
internal carotid lumen as the denominator for stenosis measurement.

The following velocity measurements were obtained:

RIGHT

ICA:  Systolic 150 cm/sec, Diastolic 24 cm/sec

CCA:  87 cm/sec

SYSTOLIC ICA/CCA RATIO:

ECA:  302 cm/sec

LEFT

ICA:  Systolic 99 cm/sec, Diastolic 21 cm/sec

CCA:  101 cm/sec

SYSTOLIC ICA/CCA RATIO:

ECA:  95 cm/sec

Right Brachial SBP: Not acquired

Left Brachial SBP: Not acquired

RIGHT CAROTID ARTERY: No significant calcifications of the right
common carotid artery. Intermediate waveform maintained.
Heterogeneous and partially calcified plaque at the right carotid
bifurcation. Lumen shadowing present. Low resistance waveform of
the right ICA. No significant tortuosity.

RIGHT VERTEBRAL ARTERY: Antegrade flow with low resistance waveform.

LEFT CAROTID ARTERY: No significant calcifications of the left
common carotid artery. Intermediate waveform maintained.
Heterogeneous and partially calcified plaque at the left carotid
bifurcation without significant lumen shadowing. Low resistance
waveform of the left ICA. No significant tortuosity.

LEFT VERTEBRAL ARTERY:  Antegrade flow with low resistance waveform.
IMPRESSION: Right:

Heterogeneous and partially calcified plaque at the right carotid
bifurcation, with discordant results regarding degree of stenosis by
established duplex criteria. Peak velocity suggests 50%-69%
stenosis, with the ICA/ CCA ratio suggesting a lesser degree of
stenosis. If establishing a more accurate degree of stenosis is
required, cerebral angiogram should be considered, or as a second
best test, CTA.

Left:

Color duplex indicates minimal heterogeneous and calcified plaque,
with no hemodynamically significant stenosis by duplex criteria in
the extracranial cerebrovascular circulation.

## 2023-05-08 ENCOUNTER — Other Ambulatory Visit: Payer: Self-pay

## 2023-05-08 DIAGNOSIS — I6521 Occlusion and stenosis of right carotid artery: Secondary | ICD-10-CM

## 2023-05-15 DIAGNOSIS — N182 Chronic kidney disease, stage 2 (mild): Secondary | ICD-10-CM | POA: Diagnosis not present

## 2023-05-15 DIAGNOSIS — E785 Hyperlipidemia, unspecified: Secondary | ICD-10-CM | POA: Diagnosis not present

## 2023-05-15 DIAGNOSIS — E1121 Type 2 diabetes mellitus with diabetic nephropathy: Secondary | ICD-10-CM | POA: Diagnosis not present

## 2023-05-18 ENCOUNTER — Encounter: Payer: Self-pay | Admitting: Internal Medicine

## 2023-05-18 DIAGNOSIS — M5441 Lumbago with sciatica, right side: Secondary | ICD-10-CM | POA: Diagnosis not present

## 2023-05-18 DIAGNOSIS — E785 Hyperlipidemia, unspecified: Secondary | ICD-10-CM | POA: Diagnosis not present

## 2023-05-18 DIAGNOSIS — Z0001 Encounter for general adult medical examination with abnormal findings: Secondary | ICD-10-CM | POA: Diagnosis not present

## 2023-05-18 DIAGNOSIS — J302 Other seasonal allergic rhinitis: Secondary | ICD-10-CM | POA: Diagnosis not present

## 2023-05-18 DIAGNOSIS — E1122 Type 2 diabetes mellitus with diabetic chronic kidney disease: Secondary | ICD-10-CM | POA: Diagnosis not present

## 2023-05-18 DIAGNOSIS — I10 Essential (primary) hypertension: Secondary | ICD-10-CM | POA: Diagnosis not present

## 2023-05-18 DIAGNOSIS — R600 Localized edema: Secondary | ICD-10-CM | POA: Diagnosis not present

## 2023-05-18 DIAGNOSIS — I130 Hypertensive heart and chronic kidney disease with heart failure and stage 1 through stage 4 chronic kidney disease, or unspecified chronic kidney disease: Secondary | ICD-10-CM | POA: Diagnosis not present

## 2023-05-18 DIAGNOSIS — N182 Chronic kidney disease, stage 2 (mild): Secondary | ICD-10-CM | POA: Diagnosis not present

## 2023-06-01 ENCOUNTER — Ambulatory Visit (HOSPITAL_COMMUNITY)
Admission: RE | Admit: 2023-06-01 | Discharge: 2023-06-01 | Disposition: A | Discharge status: Home / Self Care | Payer: Medicare HMO | Source: Ambulatory Visit | Attending: Internal Medicine | Admitting: Internal Medicine

## 2023-06-01 ENCOUNTER — Encounter: Payer: Self-pay | Admitting: Internal Medicine

## 2023-06-01 ENCOUNTER — Ambulatory Visit: Payer: Medicare HMO | Admitting: Internal Medicine

## 2023-06-01 VITALS — BP 140/76 | HR 61 | Ht 72.0 in | Wt 258.4 lb

## 2023-06-01 DIAGNOSIS — J189 Pneumonia, unspecified organism: Secondary | ICD-10-CM | POA: Diagnosis not present

## 2023-06-01 DIAGNOSIS — R0609 Other forms of dyspnea: Secondary | ICD-10-CM

## 2023-06-01 DIAGNOSIS — R0602 Shortness of breath: Secondary | ICD-10-CM | POA: Diagnosis not present

## 2023-06-01 NOTE — Patient Instructions (Signed)
Continue to walk as much you can daily pushing to where you are a little short of breath but never out of breath   Please remember to go to the  x-ray department  @  [facility] for your tests - we will call you with the results when they are available     I will call when I finish reviewing your records/ results with a plan on evaluating your breahting further if indicated.

## 2023-06-01 NOTE — Progress Notes (Unsigned)
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  See results below - Lasix PO 40 mg daily: Cardiology to make adjustments at follow-up -Fluid restrict 1223ml/day - Losartan 25 mg BID -Strict in and out -3.2 L - Daily weight      Filed Weights    03/10/21 0500 03/11/21 0537 03/12/21 0500  Weight: 115.2 kg 116.4 kg 117 kg  -Weigh yourself daily 117 kg (258 pounds) this is your base weight -TED hose 12 hours on during the day, 12 hours off at night -9/1 consult to cardiology PA Randall An. -Follow-up with PA Randall An cardiology 04/13/2021 @ 1530    Elevated troponin/Demand ischemia -Troponin x2-19 > 18 consistent with demand ischemia -continue aspirin 81 mg p.o. daily.     Essential hypertension (uncontrolled)  - See CHF   Hypokalemia - Potassium goal> 4 -Potassium PO 20 mg daily: Cardiology to make adjustments at follow-up  Mixed HLD -Pravastatin 40 mg daily  BPH -Alfuzosin 10 mg daily -Finasteride 5 mg daily     Obese (BMI 35.79 kg/m) -Address with PCP        Discharge Diagnoses:  Principal Problem:   CHF (congestive heart failure) (HCC)   Elevated troponin   Essential hypertension   Mixed hyperlipidemia   BPH (benign prostatic hyperplasia)   Elevated brain natriuretic peptide (BNP) level     Discharge Condition: Stable   Diet recommendation: Heart healthy        Filed Weights    03/10/21 0500 03/11/21 0537 03/12/21 0500  Weight: 115.2 kg 116.4 kg 117 kg      History of present illness:  Adam Banks is an 88 y.o. WM PMHx  hypertension, hyperlipidemia, BPH    Presents  to the emergency department due to shortness of breath.  Patient complained of about 36-month history of nasal stuffiness/facial congestion with cough.  He was treated with Flonase without improvement, patient states that he was unable to breathe in enough air on attempt to take a deep breath.  Symptoms are usually intermittent.  On attempt to go to his mailbox at home, shortness of breath worsened significantly.  Symptoms worsened today with sensation of  difficulty in being able to take a deep breath.  He endorsed no changes in his chronic leg swelling and denies fever, chills, cough, nausea, vomiting or abdominal pain.   ED Course:  In the emergency department, he was bradycardic, BP was 167/69, other vital signs were within normal range.  Work-up in the ED showed normal CBC and BMP, BNP was 186, troponin x1 was 19, influenza A, B, SARS coronavirus 2 were negative. Chest x-ray showed no active cardiopulmonary disease.   IV Lasix 40 mg x 1 was given, aspirin 324 mg p.o. x1 was given.  Hospitalist was asked to admit patient for further evaluation and management.   Hospital Course:  See above   Procedures:  03/10/23  Echocardiogram  Left Ventricle: Left ventricular ejection fraction, by estimation, is  >75%. The left ventricle has hyperdynamic function. The left ventricle has  no regional wall motion abnormalities. The left ventricular internal  cavity size was normal  in size. There  is moderate asymmetric left ventricular hypertrophy of the septal segment.  Left ventricular diastolic parameters are consistent with Grade I  diastolic dysfunction (impaired relaxation).       History of Present Illness  06/01/2023  Pulmonary/ 1st office eval/ Adam Banks / [place] Office at wt [phone])  Chief Complaint  Patient presents with   Pulmonary Consult    Referred by Dr. Retta Diones. Pt c/o DOE for approx 3 years- gets winded walking to mailbox and back approx 200 ft.   Can Dyspnea:  says can walk up  to 20 min at a time nl pace then sob but slower pace than baseline since admit above  Cough: usual nasal drainage in am clear some also at hs but not disturbing sleep/  no better on zyrtec  Sleep: bed is flat one pillow  SABA use: none  02: none  Lung cancer screen: never smoker   No obvious day to day or daytime pattern/variability or assoc excess/ purulent sputum or mucus plugs or hemoptysis or cp or chest tightness, subjective wheeze or overt sinus or hb symptoms.    Also denies any obvious fluctuation of symptoms with weather or environmental changes or other aggravating or alleviating factors except as outlined above   No unusual exposure hx or h/o childhood pna/ asthma or knowledge of premature birth.  Current Allergies, Complete Past Medical History, Past Surgical History, Family History, and Social History were reviewed in [employer] record.  ROS  The following are not active complaints unless bolded Hoarseness, sore throat, dysphagia, dental problems, itching, sneezing,  nasal congestion and sensation of  discharge of excess mucus or purulent secretions, ear ache,   fever, chills, sweats, unintended wt loss or wt gain, classically pleuritic or exertional cp,  orthopnea pnd or arm/hand swelling  or leg swelling, presyncope, palpitations, abdominal pain, anorexia, nausea, vomiting, diarrhea  or change in bowel habits or change in bladder habits, change in stools or change in urine, dysuria, hematuria,  rash, arthralgias, visual complaints, headache, numbness, weakness or ataxia or problems with walking or coordination,  change in mood or  memory.            Outpatient Medications Prior to Visit  Medication Sig Dispense Refill   alfuzosin (UROXATRAL) 10 MG 24 hr tablet Take 1 tablet (10 mg total) by mouth every evening. 90 tablet 3   aspirin EC 81 MG EC tablet Take 1 tablet (81 mg total) by mouth daily. Swallow whole. 30 tablet 0   ezetimibe (ZETIA) 10 MG tablet  Take 1 tablet by mouth once daily 90 tablet 3   finasteride (PROSCAR) 5 MG tablet Take 1 tablet (5 mg total) by mouth daily. 90 tablet 3   fluticasone (FLONASE) 50 MCG/ACT nasal spray Place 1 spray into both nostrils 2 (two) times daily. 9.9 mL 0   furosemide (LASIX) 40 MG tablet Take 1 tablet by mouth once daily 30 tablet 15   levocetirizine (XYZAL) 5 MG tablet Take 5 mg by mouth every evening.     losartan (COZAAR) 25 MG tablet Take 1 tablet (25 mg total) by mouth 2 (two) times daily. 180 tablet 3   naproxen sodium (ALEVE) 220 MG tablet Take 220 mg by mouth daily as needed (pain).     potassium chloride SA (KLOR-CON M) 20 MEQ tablet Take 1 tablet (20 mEq total) by mouth daily. 90 tablet 1   rosuvastatin (CRESTOR) 10 MG tablet Take 1 tablet (10 mg total) by  mouth daily. 90 tablet 1   sodium chloride (OCEAN) 0.65 % SOLN nasal spray Place 1 spray into both nostrils as needed for congestion. 15 mL 0   No facility-administered medications prior to visit.    Past Medical History:  Diagnosis Date   Enlarged prostate 2014   History of urinary hesitancy    Hypertension       Objective:     BP (!) 140/76 (BP Location: Left Arm, Cuff Size: Normal)   Pulse 61   Ht 6' (1.829 m)   Wt 258 lb 6.4 oz (117.2 kg)   SpO2 98%   BMI 35.05 kg/m   SpO2: 98 % RA   Mod obese (by BMI) somber  amb wm nad    HEENT : Oropharynx  clear      Nasal turbinates nl   NECK :  without  apparent JVD/ palpable Nodes/TM    LUNGS: no acc muscle use,  Nl contour chest  with distant bs esp bases with minimal course insp crackles in bases    CV:  RRR  no s3 or murmur or increase in P2, and no edema   ABD: quite obese  soft and nontender with nl inspiratory excursion in the supine position. No bruits or organomegaly appreciated   MS: slt awkard gait/ ext warm without deformities Or obvious joint restrictions  calf tenderness, cyanosis or clubbing    SKIN: warm and dry without lesions    NEURO:  alert,  approp, nl sensorium with  no motor or cerebellar deficits apparent.     CXR PA and Lateral:   06/01/2023 :    I personally reviewed images and impression is as follows:     Small lung vol with very large area of eventration R ant HD   Labs ordered  TSH, cbc with Eos, BNP            Assessment   DOE (dyspnea on exertion) Never smoker/ onset 02/2021 with admit dx of  chf/diastolic dysfunction only  - 06/01/2023   Walked on RA  x  3  lap(s) =  approx 450  ft  @ mod pace, stopped due to end of study  with lowest 02 sats 96% and min sob  Overall pattern is one of diastolic dysfunction plus obesity/ deconditioning pending labs to complete the eval.  He has severe eventration of the R HD  which amplifies the effects of obesity on lung volumes and inspiratory muscle reserve as the R HD is the more important of the two given that the R lung is bigger than than Left.  Rec: Sub max ex daily at 30 min daily if possible Consider CPST if not progressing in terms of desired improvement is ex tol   Otherwise no f/u in pulmonary clinic needed  Each maintenance medication was reviewed in detail including emphasizing most importantly the difference between maintenance and prns and under what circumstances the prns are to be triggered using an action plan format where appropriate.  Total time for H and P, chart review, counseling,  directly observing portions of ambulatory 02 saturation study/ and generating customized AVS unique to this office visit / same day charting > 45 min with pt new to me                    Sandrea Hughs, MD 06/01/2023

## 2023-06-02 NOTE — Assessment & Plan Note (Signed)
Never smoker/ onset 02/2021 with admit dx of  chf/diastolic dysfunction only  - 06/01/2023   Walked on RA  x  3  lap(s) =  approx 450  ft  @ mod pace, stopped due to end of study  with lowest 02 sats 96% and min sob  Overall pattern is one of diastolic dysfunction plus obesity/ deconditioning pending labs to complete the eval.  He has severe eventration of the R HD  which amplifies the effects of obesity on lung volumes and inspiratory muscle reserve as the R HD is the more important of the two given that the R lung is bigger than than Left.  Rec: Sub max ex daily at 30 min daily if possible Consider CPST if not progressing in terms of desired improvement is ex tol   Otherwise no f/u in pulmonary clinic needed  Each maintenance medication was reviewed in detail including emphasizing most importantly the difference between maintenance and prns and under what circumstances the prns are to be triggered using an action plan format where appropriate.  Total time for H and P, chart review, counseling,  directly observing portions of ambulatory 02 saturation study/ and generating customized AVS unique to this office visit / same day charting > 45 min with pt new to me

## 2023-06-04 LAB — CBC WITH DIFFERENTIAL/PLATELET
Basophils Absolute: 0 10*3/uL (ref 0.0–0.2)
Basos: 1 %
EOS (ABSOLUTE): 0.1 10*3/uL (ref 0.0–0.4)
Eos: 2 %
Hematocrit: 41 % (ref 37.5–51.0)
Hemoglobin: 13.7 g/dL (ref 13.0–17.7)
Immature Grans (Abs): 0 10*3/uL (ref 0.0–0.1)
Immature Granulocytes: 0 %
Lymphocytes Absolute: 1.2 10*3/uL (ref 0.7–3.1)
Lymphs: 21 %
MCH: 31.1 pg (ref 26.6–33.0)
MCHC: 33.4 g/dL (ref 31.5–35.7)
MCV: 93 fL (ref 79–97)
Monocytes Absolute: 0.5 10*3/uL (ref 0.1–0.9)
Monocytes: 9 %
Neutrophils Absolute: 4 10*3/uL (ref 1.4–7.0)
Neutrophils: 67 %
Platelets: 215 10*3/uL (ref 150–450)
RBC: 4.4 x10E6/uL (ref 4.14–5.80)
RDW: 12.6 % (ref 11.6–15.4)
WBC: 5.9 10*3/uL (ref 3.4–10.8)

## 2023-06-04 LAB — TSH: TSH: 5.82 u[IU]/mL — ABNORMAL HIGH (ref 0.450–4.500)

## 2023-06-04 LAB — BRAIN NATRIURETIC PEPTIDE: BNP: 139.8 pg/mL — ABNORMAL HIGH (ref 0.0–100.0)

## 2023-06-05 NOTE — Progress Notes (Signed)
Called pt and there was no answer and no option to leave msg. Will call back.

## 2023-06-10 NOTE — Progress Notes (Unsigned)
Cardiology Office Note   Date:  06/12/2023   ID:  Adam Banks, DOB [birth year], MRN [id number]  PCP:  Benita Stabile, MD  Cardiologist:   Dietrich Pates, MD   Pt presents for follow up of dyspnea      History of Present Illness: Adam Banks is a 88 y.o. male with a history of  syncope (2013; felt to be due to dehydration, orthostasis, meds), chest discomfort, DOE, HTN, HFpEF and aortic stenosis    I last saw the pt in clinic in 2022  He was seen by Leda Gauze in the interval Echo in 2023 showed normal LVEF, RVEF   Moderate AS( mean gradient 15 mm; Dimensionless index 0.44)  The pt denies CP   He says he still gets  SOB with walking to mailbox  About 200 feet   The pt's wife says he stays busy all the time outside  Chopping wood, working in yard     He denies dizziness  No palpitations No PND     Feels more popping in joints and arms   Says he did better on 5 mg Crestor     Current Meds  Medication Sig   alfuzosin (UROXATRAL) 10 MG 24 hr tablet Take 1 tablet (10 mg total) by mouth every evening.   aspirin EC 81 MG EC tablet Take 1 tablet (81 mg total) by mouth daily. Swallow whole.   ezetimibe (ZETIA) 10 MG tablet Take 1 tablet by mouth once daily   finasteride (PROSCAR) 5 MG tablet Take 1 tablet (5 mg total) by mouth daily.   fluticasone (FLONASE) 50 MCG/ACT nasal spray Place 1 spray into both nostrils 2 (two) times daily.   FLUZONE HIGH-DOSE 0.5 ML injection Inject 0.5 mLs into the muscle once.   furosemide (LASIX) 40 MG tablet Take 1 tablet by mouth once daily   levocetirizine (XYZAL) 5 MG tablet Take 5 mg by mouth every evening.   losartan (COZAAR) 25 MG tablet Take 1 tablet (25 mg total) by mouth 2 (two) times daily.   naproxen sodium (ALEVE) 220 MG tablet Take 220 mg by mouth daily as needed (pain).   potassium chloride SA (KLOR-CON M) 20 MEQ tablet Take 1 tablet (20 mEq total) by mouth daily.   PREVNAR 20 0.5 ML injection Inject 0.5 mLs into the muscle once.    rosuvastatin (CRESTOR) 5 MG tablet Take 1 tablet (5 mg total) by mouth daily.   [DISCONTINUED] rosuvastatin (CRESTOR) 10 MG tablet Take 1 tablet (10 mg total) by mouth daily.     Allergies:   Flomax [tamsulosin hcl]   Past Medical History:  Diagnosis Date   Enlarged prostate 2014   History of urinary hesitancy    Hypertension     Past Surgical History:  Procedure Laterality Date   HYDROCELE EXCISION Left 05/05/2014   Procedure: LEFT HYDROCELECTOMY ADULT;  Surgeon: Chelsea Aus, MD;  Location: [facility];  Service: Urology;  Laterality: Left;   INGUINAL HERNIA REPAIR Right 1977   TONSILLECTOMY       Social History:  The patient  reports that he has never smoked. He has never been exposed to tobacco smoke. He has never used smokeless tobacco. He reports that he does not drink alcohol and does not use drugs.   Family History:  [name] family history includes Heart attack in his father; Hodgkin's lymphoma in his mother.    ROS:  Please see the history of present illness. All other systems are  reviewed and  Negative to the above problem except as noted.    PHYSICAL EXAM: VS:  BP (!) 144/70   Pulse (!) 56   Ht 6' (1.829 m)   Wt 262 lb 6.4 oz (119 kg)   SpO2 98%   BMI 35.59 kg/m   GEN: Morbidly obese 88 yo in no acute distress  HEENT: normal  Neck: no JVD, no carotid bruits Cardiac: RRR; Gr II/VI systolic murmur base   Respiratory:  clear to auscultation bilaterally GI: soft, nontender, no masses  EKG:  EKG shows SB 57 bpm     1. Left ventricular ejection fraction, by estimation, is 60 to 65%. The  left ventricle has normal function. The left ventricle has no regional  wall motion abnormalities. There is mild concentric left ventricular  hypertrophy. Left ventricular diastolic  parameters are consistent with Grade I diastolic dysfunction (impaired  relaxation).   2. Right ventricular systolic function is normal. The right ventricular  size is normal. Tricuspid  regurgitation signal is inadequate for assessing  PA pressure.   3. Left atrial size was mild to moderately dilated.   4. Right atrial size was mildly dilated.   5. The mitral valve is grossly normal. Trivial mitral valve  regurgitation.   6. The aortic valve is tricuspid. There is moderate calcification of the  aortic valve. Aortic valve regurgitation is not visualized. Moderate  aortic valve stenosis. Aortic valve area, by VTI measures 1.39 cm. Aortic  valve mean gradient measures 15.0  mmHg. Dimentionless index 0.44.   7. The inferior vena cava is normal in size with greater than 50%  respiratory variability, suggesting right atrial pressure of 3 mmHg.   Comparison(s): Prior images reviewed side by side. Aortic stenosis now  moderate.   . Lipid Panel    Component Value Date/Time   CHOL 157 05/16/2021 0830   TRIG 131 05/16/2021 0830   HDL 37 (L) 05/16/2021 0830   CHOLHDL 4.2 05/16/2021 0830   CHOLHDL 3.7 05/05/2019 0916   VLDL 19 05/05/2019 0916   LDLCALC 96 05/16/2021 0830      Wt Readings from Last 3 Encounters:  06/11/23 262 lb 6.4 oz (119 kg)  06/01/23 258 lb 6.4 oz (117.2 kg)  04/11/23 255 lb (115.7 kg)      ASSESSMENT AND PLAN:  1  Dyspnea. Pt with long hx of DOE Has been relatively stable  with walking 200 feet   Echo normal PFTs with no evid of obstruction   Did have decreased DLCO (moderate)Pt with recent admit for SOB   Rx with lasix   May be doing some better   NO CP   On exam today volume is not bad.  Murmur of AS    2  AV dz   Moderate AS on echo in 2023   Will repeat at after next visist    3  CV dz   OCt 2024  Moderate CV dz on RICA   Mild dz LICA     4 HL  He still gets achy on Crestor 10  Will decrease to 5 mg  Follow u lipomed and CK in 12 wks        5   Dizziness   Occasional, with change in position      Current medicines are reviewed at length with the patient today.  The patient does not have concerns regarding  medicines.  Signed, Dietrich Pates, MD  06/12/2023 12:23 AM    [facility]  [facility] [address] [place], [place], [place]  [address] Phone: [phone]; Fax: [phone]

## 2023-06-11 ENCOUNTER — Encounter: Payer: Self-pay | Admitting: Internal Medicine

## 2023-06-11 ENCOUNTER — Ambulatory Visit: Payer: Medicare HMO | Attending: Internal Medicine | Admitting: Internal Medicine

## 2023-06-11 VITALS — BP 144/70 | HR 56 | Ht 72.0 in | Wt 262.4 lb

## 2023-06-11 DIAGNOSIS — I350 Nonrheumatic aortic (valve) stenosis: Secondary | ICD-10-CM | POA: Diagnosis not present

## 2023-06-11 DIAGNOSIS — M791 Myalgia, unspecified site: Secondary | ICD-10-CM | POA: Diagnosis not present

## 2023-06-11 DIAGNOSIS — I10 Essential (primary) hypertension: Secondary | ICD-10-CM | POA: Diagnosis not present

## 2023-06-11 DIAGNOSIS — E782 Mixed hyperlipidemia: Secondary | ICD-10-CM | POA: Diagnosis not present

## 2023-06-11 MED ORDER — ROSUVASTATIN CALCIUM 5 MG PO TABS: 5.0000 mg | ORAL_TABLET | Freq: Every day | ORAL | 3 refills | Status: DC

## 2023-06-11 NOTE — Patient Instructions (Addendum)
Medication Instructions:   Decrease Crestor to 5 mg Daily with supper  *If you need a refill on your cardiac medications before your next appointment, please call your pharmacy*   Lab Work: Your physician recommends that you return for lab work in: 8 Weeks ( At [facility] )   If you have labs (blood work) drawn today and your tests are completely normal, you will receive your results only by: MyChart Message (if you have MyChart) OR A paper copy in the mail If you have any lab test that is abnormal or we need to change your treatment, we will call you to review the results.   Testing/Procedures: Your physician has requested that you have an echocardiogram. Echocardiography is a painless test that uses sound waves to create images of your heart. It provides your doctor with information about the size and shape of your heart and how well your heart's chambers and valves are working. This procedure takes approximately one hour. There are no restrictions for this procedure. Please do NOT wear cologne, perfume, aftershave, or lotions (deodorant is allowed). Please arrive 15 minutes prior to your appointment time.  Please note: We ask at that you not bring children with you during ultrasound (echo/ vascular) testing. Due to room size and safety concerns, children are not allowed in the ultrasound rooms during exams. Our front office staff cannot provide observation of children in our lobby area while testing is being conducted. An adult accompanying a patient to their appointment will only be allowed in the ultrasound room at the discretion of the ultrasound technician under special circumstances. We apologize for any inconvenience.    Follow-Up: At [facility], you and your health needs are our priority.  As part of our continuing mission to provide you with exceptional heart care, we have created designated Provider Care Teams.  These Care Teams include your primary  Cardiologist (physician) and Advanced Practice Providers (APPs -  Physician Assistants and Nurse Practitioners) who all work together to provide you with the care you need, when you need it.  We recommend signing up for the patient portal called "MyChart".  Sign up information is provided on this After Visit Summary.  MyChart is used to connect with patients for Virtual Visits (Telemedicine).  Patients are able to view lab/test results, encounter notes, upcoming appointments, etc.  Non-urgent messages can be sent to your provider as well.   To learn more about what you can do with MyChart, go to [email or web address].    Your next appointment:    July   Provider:   You may see Dietrich Pates, MD or one of the following Advanced Practice Providers on your designated Care Team:   Randall An, PA-C  Jacolyn Reedy, PA-C     Other Instructions Thank you for choosing [facility]!

## 2023-07-01 ENCOUNTER — Other Ambulatory Visit: Payer: Self-pay | Admitting: Internal Medicine

## 2023-07-02 ENCOUNTER — Encounter: Payer: Self-pay | Admitting: Internal Medicine

## 2023-07-02 NOTE — Progress Notes (Signed)
Tried calling the pt and there was no answer- will mail letter

## 2023-07-14 DIAGNOSIS — J069 Acute upper respiratory infection, unspecified: Secondary | ICD-10-CM | POA: Diagnosis not present

## 2023-07-17 ENCOUNTER — Telehealth: Payer: Self-pay | Admitting: Internal Medicine

## 2023-07-17 NOTE — Telephone Encounter (Signed)
 Wife (DPR) calling about call she had from [place] on abnormal BW. Please call @ [phone]

## 2023-07-18 NOTE — Telephone Encounter (Signed)
 Adam KATHEE America, MD 06/04/2023  7:40 PM EST     Call patient :  Studies are suggestive of mild hypothyroidism but no need for immediate concern .   Send copy to Dr Milford office.   I called and spoke with the pt's spouse and notified of response per Dr Banks  She verbalized understanding  I routed copy to Dr Shona of the labs  Nothing further needed

## 2023-07-19 ENCOUNTER — Ambulatory Visit (HOSPITAL_COMMUNITY): Payer: Medicare HMO

## 2023-07-23 ENCOUNTER — Other Ambulatory Visit: Payer: Self-pay | Admitting: Internal Medicine

## 2023-07-29 ENCOUNTER — Other Ambulatory Visit: Payer: Self-pay | Admitting: Internal Medicine

## 2023-07-30 ENCOUNTER — Ambulatory Visit (HOSPITAL_COMMUNITY)
Admission: RE | Admit: 2023-07-30 | Discharge: 2023-07-30 | Disposition: A | Discharge status: Home / Self Care | Payer: Medicare HMO | Source: Ambulatory Visit | Attending: Internal Medicine | Admitting: Internal Medicine

## 2023-07-30 DIAGNOSIS — I350 Nonrheumatic aortic (valve) stenosis: Secondary | ICD-10-CM | POA: Diagnosis not present

## 2023-07-30 LAB — ECHOCARDIOGRAM COMPLETE
AR max vel: 1.33 cm2
AV Area VTI: 1.42 cm2
AV Area mean vel: 1.35 cm2
AV Mean grad: 16.3 mm[Hg]
AV Peak grad: 27.6 mm[Hg]
Ao pk vel: 2.63 m/s
Area-P 1/2: 1.89 cm2
S' Lateral: 2.8 cm

## 2023-07-30 NOTE — Progress Notes (Signed)
*  PRELIMINARY RESULTS* Echocardiogram 2D Echocardiogram has been performed.  Stacey Drain 07/30/2023, 10:38 AM

## 2023-09-03 ENCOUNTER — Telehealth: Payer: Self-pay | Admitting: Internal Medicine

## 2023-09-03 NOTE — Telephone Encounter (Signed)
  [name] wife is calling and would like to inquire whether the patient can wait until next week, 09/12/23, to have his lab work done, as he will be seeing his PCP at that time. If this is acceptable, could the lab order be faxed to Dr. Scharlene Gloss office? She kindly requests that you call her back for confirmation. She can be reached at her phone number or the home phone number on file.

## 2023-09-03 NOTE — Telephone Encounter (Signed)
 Spoke with the patient's wife and advised that it was okay for him to wait to have labs done next week. Orders have been sent over to his PCP.

## 2023-09-12 DIAGNOSIS — E1122 Type 2 diabetes mellitus with diabetic chronic kidney disease: Secondary | ICD-10-CM | POA: Diagnosis not present

## 2023-09-12 DIAGNOSIS — E785 Hyperlipidemia, unspecified: Secondary | ICD-10-CM | POA: Diagnosis not present

## 2023-09-18 DIAGNOSIS — E1165 Type 2 diabetes mellitus with hyperglycemia: Secondary | ICD-10-CM | POA: Diagnosis not present

## 2023-09-18 DIAGNOSIS — N182 Chronic kidney disease, stage 2 (mild): Secondary | ICD-10-CM | POA: Diagnosis not present

## 2023-09-18 DIAGNOSIS — E1169 Type 2 diabetes mellitus with other specified complication: Secondary | ICD-10-CM | POA: Diagnosis not present

## 2023-09-18 DIAGNOSIS — E1122 Type 2 diabetes mellitus with diabetic chronic kidney disease: Secondary | ICD-10-CM | POA: Diagnosis not present

## 2023-09-18 DIAGNOSIS — L570 Actinic keratosis: Secondary | ICD-10-CM | POA: Diagnosis not present

## 2023-09-18 DIAGNOSIS — L820 Inflamed seborrheic keratosis: Secondary | ICD-10-CM | POA: Diagnosis not present

## 2023-09-18 DIAGNOSIS — I10 Essential (primary) hypertension: Secondary | ICD-10-CM | POA: Diagnosis not present

## 2023-09-18 DIAGNOSIS — E1159 Type 2 diabetes mellitus with other circulatory complications: Secondary | ICD-10-CM | POA: Diagnosis not present

## 2023-09-18 DIAGNOSIS — X32XXXD Exposure to sunlight, subsequent encounter: Secondary | ICD-10-CM | POA: Diagnosis not present

## 2023-09-18 DIAGNOSIS — I130 Hypertensive heart and chronic kidney disease with heart failure and stage 1 through stage 4 chronic kidney disease, or unspecified chronic kidney disease: Secondary | ICD-10-CM | POA: Diagnosis not present

## 2023-09-18 DIAGNOSIS — M5441 Lumbago with sciatica, right side: Secondary | ICD-10-CM | POA: Diagnosis not present

## 2023-09-18 DIAGNOSIS — E785 Hyperlipidemia, unspecified: Secondary | ICD-10-CM | POA: Diagnosis not present

## 2023-12-31 DIAGNOSIS — R42 Dizziness and giddiness: Secondary | ICD-10-CM | POA: Diagnosis not present

## 2023-12-31 DIAGNOSIS — I10 Essential (primary) hypertension: Secondary | ICD-10-CM | POA: Diagnosis not present

## 2024-01-14 DIAGNOSIS — E1122 Type 2 diabetes mellitus with diabetic chronic kidney disease: Secondary | ICD-10-CM | POA: Diagnosis not present

## 2024-01-14 DIAGNOSIS — E785 Hyperlipidemia, unspecified: Secondary | ICD-10-CM | POA: Diagnosis not present

## 2024-01-18 DIAGNOSIS — N182 Chronic kidney disease, stage 2 (mild): Secondary | ICD-10-CM | POA: Diagnosis not present

## 2024-01-18 DIAGNOSIS — E785 Hyperlipidemia, unspecified: Secondary | ICD-10-CM | POA: Diagnosis not present

## 2024-01-18 DIAGNOSIS — I130 Hypertensive heart and chronic kidney disease with heart failure and stage 1 through stage 4 chronic kidney disease, or unspecified chronic kidney disease: Secondary | ICD-10-CM | POA: Diagnosis not present

## 2024-01-18 DIAGNOSIS — I5032 Chronic diastolic (congestive) heart failure: Secondary | ICD-10-CM | POA: Diagnosis not present

## 2024-01-18 DIAGNOSIS — E1122 Type 2 diabetes mellitus with diabetic chronic kidney disease: Secondary | ICD-10-CM | POA: Diagnosis not present

## 2024-01-18 DIAGNOSIS — R600 Localized edema: Secondary | ICD-10-CM | POA: Diagnosis not present

## 2024-01-18 DIAGNOSIS — I129 Hypertensive chronic kidney disease with stage 1 through stage 4 chronic kidney disease, or unspecified chronic kidney disease: Secondary | ICD-10-CM | POA: Diagnosis not present

## 2024-01-18 DIAGNOSIS — I10 Essential (primary) hypertension: Secondary | ICD-10-CM | POA: Diagnosis not present

## 2024-01-18 DIAGNOSIS — M5441 Lumbago with sciatica, right side: Secondary | ICD-10-CM | POA: Diagnosis not present

## 2024-01-19 ENCOUNTER — Ambulatory Visit: Payer: Self-pay | Admitting: Internal Medicine

## 2024-01-21 ENCOUNTER — Other Ambulatory Visit: Payer: Self-pay | Admitting: Internal Medicine

## 2024-01-21 ENCOUNTER — Other Ambulatory Visit: Payer: Self-pay

## 2024-01-21 DIAGNOSIS — E782 Mixed hyperlipidemia: Secondary | ICD-10-CM

## 2024-01-22 ENCOUNTER — Telehealth: Payer: Self-pay | Admitting: Pharmacist Clinician (PhC)/ Clinical Pharmacy Specialist

## 2024-01-22 NOTE — Telephone Encounter (Signed)
 Tried to reach out to patient/spouse about starting Repatha.  No answer.  Most recent LDL at 70, but having myalgias with rosuvastatin  5 mg daily.  [employer] insurance with $250 deductible and $47/month

## 2024-01-22 NOTE — Telephone Encounter (Signed)
-----   Message from Nurse Dorothyann C sent at 01/21/2024 11:45 AM EDT ----- Regarding: wants repatha  Saw Pharm-D 2023, do they need new referral?     01/21/24 11:45 AM Result Note [name] wife has been notified of the result and verbalized understanding.  All questions (if any) were answered. Bernett Dorothyann LABOR, RN 01/21/2024 11:43 AM    Wife states he is still intolerant to statins ,having muscle pain.She would like to discuss Repatha with PharmD again. Lab report - scanned     Okey Vina GAILS, MD t    01/19/24  9:47 PM Result Note LDL is OK   How is he tolerating statin Triglycerides and A1C are high   Need tighter glucose control   LImit carbs, sugars

## 2024-01-23 NOTE — Telephone Encounter (Signed)
 Has appt with Robbi Blanch in Sept

## 2024-02-04 DIAGNOSIS — L0202 Furuncle of face: Secondary | ICD-10-CM | POA: Diagnosis not present

## 2024-02-04 DIAGNOSIS — B9689 Other specified bacterial agents as the cause of diseases classified elsewhere: Secondary | ICD-10-CM | POA: Diagnosis not present

## 2024-02-28 ENCOUNTER — Other Ambulatory Visit: Payer: Self-pay | Admitting: Urology

## 2024-02-28 DIAGNOSIS — R55 Syncope and collapse: Secondary | ICD-10-CM

## 2024-02-28 DIAGNOSIS — R351 Nocturia: Secondary | ICD-10-CM

## 2024-02-28 DIAGNOSIS — R001 Bradycardia, unspecified: Secondary | ICD-10-CM

## 2024-03-07 ENCOUNTER — Telehealth: Payer: Self-pay | Admitting: Pharmacist

## 2024-03-07 ENCOUNTER — Telehealth: Payer: Self-pay | Admitting: Pharmacy Technician

## 2024-03-07 ENCOUNTER — Ambulatory Visit: Attending: Cardiology | Admitting: Pharmacist

## 2024-03-07 ENCOUNTER — Encounter: Payer: Self-pay | Admitting: Pharmacist

## 2024-03-07 ENCOUNTER — Other Ambulatory Visit (HOSPITAL_COMMUNITY): Payer: Self-pay

## 2024-03-07 DIAGNOSIS — E782 Mixed hyperlipidemia: Secondary | ICD-10-CM | POA: Diagnosis not present

## 2024-03-07 MED ORDER — REPATHA SURECLICK 140 MG/ML ~~LOC~~ SOAJ
140.0000 mg | SUBCUTANEOUS | 3 refills | Status: DC
  Filled 2024-03-07: qty 6, 84d supply, fill #0
  Filled 2024-05-05 – 2024-05-16 (×2): qty 6, 84d supply, fill #1

## 2024-03-07 NOTE — Telephone Encounter (Signed)
 From pt calls    Insurance pays for brand not generic. Vascepa copay 235.90 for 30 days

## 2024-03-07 NOTE — Telephone Encounter (Signed)
 From pt calls   Pharmacy Patient Advocate Encounter   Received notification from Pt Calls Messages that prior authorization for repatha  is required/requested.   Insurance verification completed.   The patient is insured through [employer] .   Per test claim: PA required; PA submitted to above mentioned insurance via Latent Key/confirmation #/EOC [id number] Status is pending   Pharmacy Patient Advocate Encounter  Received notification from [employer] that Prior Authorization for repatha  has been APPROVED from 07/11/23 to 07/09/24. Ran test claim, Copay is $235.90. This test claim was processed through [facility]- copay amounts may vary at other pharmacies due to pharmacy/plan contracts, or as the patient moves through the different stages of their insurance plan.   PA #/Case ID/Reference #: [id number]

## 2024-03-07 NOTE — Telephone Encounter (Signed)
 Prior Authorization for repatha  has been APPROVED from 07/11/23 to 07/09/24. Ran test claim, Copay is $235.90

## 2024-03-07 NOTE — Assessment & Plan Note (Addendum)
 Assessment and Plan:  [name] LDL goal is <70 mg/dL given her high-risk profile, which includes hypertension, carotid artery stenosis, paroxysmal atrial fibrillation, hyperlipidemia, and a history of TIA. his lowest recorded LDL was 70 mg/dL while on a combination of statin and Zetia , but he was unable to tolerate this regimen due to cramping and other side effects. As a result, we will discontinue Zetia  and initiate Repatha  140 mg subcutaneously every 14 days. To improve tolerability of statin therapy, he will also retry rosuvastatin  (Crestor ) 5 mg, starting one week after the first dose of Repatha , to be taken either daily or every other day based on tolerance. Her triglycerides were >150 mg/dL on her last lab from March 2025. Lifestyle modifications to lower triglycerides, including dietary changes and reducing intake of processed and high-carbohydrate foods, were discussed in detail. If her triglycerides remain above goal in 3 months, we will consider adding Vascepa to his lipid-lowering regimen.  To reduce the cost of Repatha , he has been enrolled in the [employer]. A follow-up lipid panel is scheduled for November 2025, and he was advised to report any side effects within 4-6 weeks.

## 2024-03-07 NOTE — Telephone Encounter (Signed)
 Patient Advocate Encounter   The patient was approved for a [employer] grant that will help cover the cost of repatha /vascepa Total amount awarded, 2500.  Effective: 02/06/24 - 02/04/25   APW:[id number] ERW:[id number] Hmnle:[id number] PI:[id number] [employer] ID: [id number]   Pharmacy provided with approval and processing information IN [facility] AND SENT TO [facility]

## 2024-03-07 NOTE — Progress Notes (Signed)
 Patient ID: Adam Banks                 DOB: 05-13-1935                    MRN: [id number]      HPI: Adam Banks is a 89 y.o. male patient referred to lipid clinic by Dr.Ross. PMH is significant for  HTN, syncope,diastolic CHF,carotid stenosis   Patient current medication Crestor  5 mg daily with Zetia  10 mg daily. Pt reports he is not been abel tolerate Crestor  low dose due to muscle and joint  aches. Presented today for lipid clinic with his wife. Reports he never tired Crestor  without Zetia . Few years back they saw Adam Banks and Repatha  was suggested but patient was not ready but now he wants to go on Repatha    Reviewed options for lowering LDL cholesterol, including  PCSK-9 inhibitors- Repatha  - insurance preferred.  Discussed mechanisms of action, dosing, side effects and potential decreases in LDL cholesterol.  Also reviewed cost information and potential options for patient assistance.   Enrolled him in the grant to make it affordable  Lifestyle discussed in details to lower TG. If in 3 months still above the goal will add Vascepa to current lipid lowering therapy  Current Medications: Zetia  10 mg daily  Intolerances: pravastatin  20-40 mg twice a week, rosuvastatin  5 mg -myalgia  Risk Factors: carotid stenosis, hypertension, family hx of CAD ( father- MI)  LDL goal: <70 mg/dl  Last lab: TC 861, TG 814, HDL 37, LDLc 70   Diet: Healthy home cooked meals, does not like fish   Exercise: none due to SOB, walks to the mail box twice daily   Family History:  Relation Problem Comments  Mother (Deceased) Hodgkin's lymphoma     Father (Deceased) Heart attack       Labs:  Lipid Panel     Component Value Date/Time   CHOL 157 05/16/2021 0830   TRIG 131 05/16/2021 0830   HDL 37 (L) 05/16/2021 0830   CHOLHDL 4.2 05/16/2021 0830   CHOLHDL 3.7 05/05/2019 0916   VLDL 19 05/05/2019 0916   LDLCALC 96 05/16/2021 0830   LABVLDL 24 05/16/2021 0830    Past Medical History:  Diagnosis  Date   Enlarged prostate 2014   History of urinary hesitancy    Hypertension     Current Outpatient Medications on File Prior to Visit  Medication Sig Dispense Refill   alfuzosin  (UROXATRAL ) 10 MG 24 hr tablet TAKE 1 TABLET EVERY EVENING 90 tablet 3   aspirin  EC 81 MG EC tablet Take 1 tablet (81 mg total) by mouth daily. Swallow whole. 30 tablet 0   ezetimibe  (ZETIA ) 10 MG tablet Take 1 tablet by mouth once daily 90 tablet 1   finasteride  (PROSCAR ) 5 MG tablet TAKE 1 TABLET EVERY DAY 90 tablet 3   fluticasone  (FLONASE ) 50 MCG/ACT nasal spray Place 1 spray into both nostrils 2 (two) times daily. 9.9 mL 0   FLUZONE HIGH-DOSE 0.5 ML injection Inject 0.5 mLs into the muscle once.     furosemide  (LASIX ) 40 MG tablet Take 1 tablet by mouth once daily 90 tablet 1   levocetirizine (XYZAL) 5 MG tablet Take 5 mg by mouth every evening.     losartan  (COZAAR ) 25 MG tablet Take 1 tablet (25 mg total) by mouth 2 (two) times daily. 180 tablet 3   naproxen sodium (ALEVE) 220 MG tablet Take 220 mg by mouth daily as  needed (pain).     potassium chloride  SA (KLOR-CON  M20) 20 MEQ tablet Take 1 tablet by mouth once daily 90 tablet 3   PREVNAR 20 0.5 ML injection Inject 0.5 mLs into the muscle once.     rosuvastatin  (CRESTOR ) 5 MG tablet Take 1 tablet (5 mg total) by mouth daily. 90 tablet 3   sodium chloride  (OCEAN) 0.65 % SOLN nasal spray Place 1 spray into both nostrils as needed for congestion. (Patient not taking: Reported on 06/11/2023) 15 mL 0   No current facility-administered medications on file prior to visit.    Allergies  Allergen Reactions   Flomax [Tamsulosin Hcl] Other (See Comments)    Patient states that he passed out.    Assessment/Plan:  1. Hyperlipidemia -  Problem  Mixed Hyperlipidemia   Current Medications: Zetia  10 mg daily  Intolerances: pravastatin  20-40 mg twice a week, rosuvastatin  5 mg -myalgia  Risk Factors: carotid stenosis, hypertension, family hx of CAD ( father- MI)   LDL goal: <70 mg/dl  Last lab: TC 861, TG 814, HDL 37, LDLc 70     Mixed hyperlipidemia Assessment and Plan:  [name] LDL goal is <70 mg/dL given her high-risk profile, which includes hypertension, carotid artery stenosis, paroxysmal atrial fibrillation, hyperlipidemia, and a history of TIA. his lowest recorded LDL was 70 mg/dL while on a combination of statin and Zetia , but he was unable to tolerate this regimen due to cramping and other side effects. As a result, we will discontinue Zetia  and initiate Repatha  140 mg subcutaneously every 14 days. To improve tolerability of statin therapy, he will also retry rosuvastatin  (Crestor ) 5 mg, starting one week after the first dose of Repatha , to be taken either daily or every other day based on tolerance. Her triglycerides were >150 mg/dL on her last lab from March 2025. Lifestyle modifications to lower triglycerides, including dietary changes and reducing intake of processed and high-carbohydrate foods, were discussed in detail. If her triglycerides remain above goal in 3 months, we will consider adding Vascepa to his lipid-lowering regimen.  To reduce the cost of Repatha , he has been enrolled in the [employer]. A follow-up lipid panel is scheduled for November 2025, and he was advised to report any side effects within 4-6 weeks.      Thank you,  Adam Banks, Pharm.D [facility] Adam Banks. [facility] [address], [place], [place] [address] Phone: [phone]; Fax: [phone]

## 2024-03-07 NOTE — Patient Instructions (Signed)
 Medication changes: stop Crestor  5 mg and Zetia  10 mg daily. Start taking Repatha  140 mg under the skin every 14 days. one week after starting Repatha  re try Crestor  5 gm every day or every other day. Call me and inform me max tolerated Crestor  dose in 4-6 weeks      Repatha  is a cholesterol medication that improved your body's ability to get rid of bad cholesterol known as LDL. It can lower your LDL up to 60%! It is an injection that is given under the skin every 2 weeks. The medication often requires a prior authorization from your insurance company. We will take care of submitting all the necessary information to your insurance company to get it approved. The most common side effects of Repatha  include runny nose, symptoms of the common cold, rarely flu or flu-like symptoms, back/muscle pain in about 3-4% of the patients, and redness, pain, or bruising at the injection site.   Lab orders: We want to repeat labs after 2-3 months.  We will send you a lab order to remind you once we get closer to that time.

## 2024-03-24 ENCOUNTER — Other Ambulatory Visit (HOSPITAL_COMMUNITY): Payer: Self-pay

## 2024-04-14 ENCOUNTER — Other Ambulatory Visit: Payer: Self-pay | Admitting: Internal Medicine

## 2024-05-05 ENCOUNTER — Other Ambulatory Visit (HOSPITAL_COMMUNITY): Payer: Self-pay

## 2024-05-05 NOTE — Progress Notes (Signed)
 Impression/Assessment:  BPH with symptoms.  On dual medical therapy-alfuzosin  and finasteride .  He does have occasional orthostatic symptoms although not very often   Plan:  1.  I think it worthwhile to try to taper off the alfuzosin  with his occasional dizziness  2.  Continue on finasteride  only  3.  Will have him come back in a year for recheck   History of Present Illness:   10.22.2025: 89 yo male here for followup of BPH w/ sx's. On dual medical therapy--finasteride , alfuzosin .  He halso has a h/o hydrocelectomy.  His biggest complaint is shortness of breath, mainly with exertion but also at times at rest.  He is not on any oxygen, has never seen a pulmonary specialist.  This has not been evaluated.  Also with complaints of LUTS-27/5 is his IPSS score.  He has not noted any urinary infections or blood in the urine over the past year.  10.28.2025: No real urinary issues over the past year.  Still on dual medical therapy with finasteride  and alfuzosin .  Current IPSS 6/2.  He does have occasional dizziness when he stands up.  Past Medical History:  Diagnosis Date   Enlarged prostate 2014   History of urinary hesitancy    Hypertension     Past Surgical History:  Procedure Laterality Date   HYDROCELE EXCISION Left 05/05/2014   Procedure: LEFT HYDROCELECTOMY ADULT;  Surgeon: Garnette CHRISTELLA Shack, MD;  Location: [facility];  Service: Urology;  Laterality: Left;   INGUINAL HERNIA REPAIR Right 1977   TONSILLECTOMY      Home Medications:  Allergies as of 05/06/2024       Reactions   Flomax [tamsulosin Hcl] Other (See Comments)   Patient states that he passed out.        Medication List        Accurate as of May 05, 2024  3:02 PM. If you have any questions, ask your nurse or doctor.          alfuzosin  10 MG 24 hr tablet Commonly known as: UROXATRAL  TAKE 1 TABLET EVERY EVENING   aspirin  EC 81 MG tablet Take 1 tablet (81 mg total) by mouth daily. Swallow  whole.   ezetimibe  10 MG tablet Commonly known as: ZETIA  Take 1 tablet by mouth once daily   finasteride  5 MG tablet Commonly known as: PROSCAR  TAKE 1 TABLET EVERY DAY   fluticasone  50 MCG/ACT nasal spray Commonly known as: FLONASE  Place 1 spray into both nostrils 2 (two) times daily.   Fluzone High-Dose 0.5 ML injection Generic drug: Influenza vac split trivalent PF Inject 0.5 mLs into the muscle once.   furosemide  40 MG tablet Commonly known as: LASIX  Take 1 tablet by mouth once daily   Klor-Con  M20 20 MEQ tablet Generic drug: potassium chloride  SA Take 1 tablet by mouth once daily   levocetirizine 5 MG tablet Commonly known as: XYZAL Take 5 mg by mouth every evening.   losartan  25 MG tablet Commonly known as: COZAAR  Take 1 tablet (25 mg total) by mouth 2 (two) times daily.   naproxen sodium 220 MG tablet Commonly known as: ALEVE Take 220 mg by mouth daily as needed (pain).   Prevnar 20 0.5 ML injection Generic drug: pneumococcal 20-valent conjugate vaccine Inject 0.5 mLs into the muscle once.   Repatha  SureClick 140 MG/ML Soaj Generic drug: Evolocumab  Inject 140 mg into the skin every 14 (fourteen) days.   rosuvastatin  5 MG tablet Commonly known as: CRESTOR  Take 1 tablet (5  mg total) by mouth daily.   sodium chloride  0.65 % Soln nasal spray Commonly known as: OCEAN Place 1 spray into both nostrils as needed for congestion.        Allergies:  Allergies  Allergen Reactions   Flomax [Tamsulosin Hcl] Other (See Comments)    Patient states that he passed out.    Family History  Problem Relation Age of Onset   Hodgkin's lymphoma Mother    Heart attack Father     Social History:  reports that he has never smoked. He has never been exposed to tobacco smoke. He has never used smokeless tobacco. He reports that he does not drink alcohol and does not use drugs.  ROS: A complete review of systems was performed.  All systems are negative except for  pertinent findings as noted.  Physical Exam:  Vital signs in last 24 hours: There were no vitals taken for this visit. Constitutional:  Alert and oriented, No acute distress Cardiovascular: Regular rate  Respiratory: Normal respiratory effort Neurologic: Grossly intact, no focal deficits Psychiatric: Normal mood and affect  I have reviewed prior pt notes  I have reviewed urinalysis results--clear  I have reviewed prior PSA results  I have reviewed IPSS form--6/2  bladder scan volume last year 120 mL

## 2024-05-06 ENCOUNTER — Encounter: Payer: Self-pay | Admitting: Urology

## 2024-05-06 ENCOUNTER — Ambulatory Visit: Payer: Medicare HMO | Admitting: Urology

## 2024-05-06 VITALS — BP 154/82 | HR 77

## 2024-05-06 DIAGNOSIS — R350 Frequency of micturition: Secondary | ICD-10-CM

## 2024-05-06 DIAGNOSIS — N400 Enlarged prostate without lower urinary tract symptoms: Secondary | ICD-10-CM

## 2024-05-06 DIAGNOSIS — N401 Enlarged prostate with lower urinary tract symptoms: Secondary | ICD-10-CM | POA: Diagnosis not present

## 2024-05-06 DIAGNOSIS — R351 Nocturia: Secondary | ICD-10-CM

## 2024-05-06 LAB — URINALYSIS, ROUTINE W REFLEX MICROSCOPIC
Bilirubin, UA: NEGATIVE
Glucose, UA: NEGATIVE
Ketones, UA: NEGATIVE
Nitrite, UA: NEGATIVE
Protein,UA: NEGATIVE
Specific Gravity, UA: 1.02 (ref 1.005–1.030)
Urobilinogen, Ur: 0.2 mg/dL (ref 0.2–1.0)
pH, UA: 6 (ref 5.0–7.5)

## 2024-05-06 LAB — MICROSCOPIC EXAMINATION: Bacteria, UA: NONE SEEN

## 2024-05-19 DIAGNOSIS — E785 Hyperlipidemia, unspecified: Secondary | ICD-10-CM | POA: Diagnosis not present

## 2024-05-19 DIAGNOSIS — E1122 Type 2 diabetes mellitus with diabetic chronic kidney disease: Secondary | ICD-10-CM | POA: Diagnosis not present

## 2024-05-22 ENCOUNTER — Encounter: Payer: Self-pay | Admitting: Internal Medicine

## 2024-05-22 DIAGNOSIS — G8929 Other chronic pain: Secondary | ICD-10-CM | POA: Diagnosis not present

## 2024-05-22 DIAGNOSIS — E785 Hyperlipidemia, unspecified: Secondary | ICD-10-CM | POA: Diagnosis not present

## 2024-05-22 DIAGNOSIS — I10 Essential (primary) hypertension: Secondary | ICD-10-CM | POA: Diagnosis not present

## 2024-05-22 DIAGNOSIS — M170 Bilateral primary osteoarthritis of knee: Secondary | ICD-10-CM | POA: Diagnosis not present

## 2024-05-22 DIAGNOSIS — N182 Chronic kidney disease, stage 2 (mild): Secondary | ICD-10-CM | POA: Diagnosis not present

## 2024-05-22 DIAGNOSIS — M25561 Pain in right knee: Secondary | ICD-10-CM | POA: Diagnosis not present

## 2024-05-22 DIAGNOSIS — Z0001 Encounter for general adult medical examination with abnormal findings: Secondary | ICD-10-CM | POA: Diagnosis not present

## 2024-05-22 DIAGNOSIS — Z0000 Encounter for general adult medical examination without abnormal findings: Secondary | ICD-10-CM | POA: Diagnosis not present

## 2024-05-22 DIAGNOSIS — M25562 Pain in left knee: Secondary | ICD-10-CM | POA: Diagnosis not present

## 2024-05-24 NOTE — Progress Notes (Unsigned)
 Cardiology Office Note   Date:  05/26/2024   ID:  Adam Banks, DOB January 31, 1935, MRN [id number]  PCP:  Shona Norleen PEDLAR, MD  Cardiologist:   Vina Gull, MD   Pt presents for follow up of dyspnea      History of Present Illness: Adam Banks is a 89 y.o. male with a history of  syncope (2013; felt to be due to dehydration, orthostasis, meds), chest discomfort, DOE, HTN, HFpEF and aortic stenosis   2023  Echo showed normal LVEF, RVEF   Moderate AS( mean gradient 15 mm; Dimensionless index 0.44)  I saw the pt in Dec 2024  Jan 2025  Echo showed LVEF normal  AV mild to moderately stenotic   Stable from previous    No CP  Breathing is good  no dizziness or passing out     Current Meds  Medication Sig   alfuzosin  (UROXATRAL ) 10 MG 24 hr tablet TAKE 1 TABLET EVERY EVENING   aspirin  EC 81 MG EC tablet Take 1 tablet (81 mg total) by mouth daily. Swallow whole.   Evolocumab  (REPATHA  SURECLICK) 140 MG/ML SOAJ Inject 140 mg into the skin every 14 (fourteen) days.   finasteride  (PROSCAR ) 5 MG tablet TAKE 1 TABLET EVERY DAY   fluticasone  (FLONASE ) 50 MCG/ACT nasal spray Place 1 spray into both nostrils 2 (two) times daily.   FLUZONE HIGH-DOSE 0.5 ML injection Inject 0.5 mLs into the muscle once.   furosemide  (LASIX ) 40 MG tablet Take 1 tablet by mouth once daily   levocetirizine (XYZAL) 5 MG tablet Take 5 mg by mouth every evening.   losartan  (COZAAR ) 25 MG tablet Take 1 tablet (25 mg total) by mouth 2 (two) times daily.   naproxen sodium (ALEVE) 220 MG tablet Take 220 mg by mouth daily as needed (pain).   potassium chloride  SA (KLOR-CON  M20) 20 MEQ tablet Take 1 tablet by mouth once daily   PREVNAR 20 0.5 ML injection Inject 0.5 mLs into the muscle once.   sodium chloride  (OCEAN) 0.65 % SOLN nasal spray Place 1 spray into both nostrils as needed for congestion.     Allergies:   Flomax [tamsulosin hcl]   Past Medical History:  Diagnosis Date   Enlarged prostate 2014   History of  urinary hesitancy    Hypertension     Past Surgical History:  Procedure Laterality Date   HYDROCELE EXCISION Left 05/05/2014   Procedure: LEFT HYDROCELECTOMY ADULT;  Surgeon: Garnette CHRISTELLA Shack, MD;  Location: [facility];  Service: Urology;  Laterality: Left;   INGUINAL HERNIA REPAIR Right 1977   TONSILLECTOMY       Social History:  The patient  reports that he has never smoked. He has never been exposed to tobacco smoke. He has never used smokeless tobacco. He reports that he does not drink alcohol and does not use drugs.   Family History:  [name] family history includes Heart attack in his father; Hodgkin's lymphoma in his mother.    ROS:  Please see the history of present illness. All other systems are reviewed and  Negative to the above problem except as noted.    PHYSICAL EXAM: VS:  BP (!) 146/73 (BP Location: Left Arm, Cuff Size: Large)   Pulse 60   Ht 6' (1.829 m)   Wt 263 lb (119.3 kg)   SpO2 97%   BMI 35.67 kg/m   GEN: Morbidly obese 89 yo in no acute distress  HEENT: normal  Neck: no JVD,  no carotid bruits Cardiac: RRR; Gr II/VI systolic murmur base   Respiratory:  clear to auscultation bilaterally GI: soft, nontender, no masses  EKG:  EKG not done today     Echo  Jan 2025   1. Left ventricular ejection fraction, by estimation, is 60 to 65%. The  left ventricle has normal function. The left ventricle has no regional  wall motion abnormalities. There is mild left ventricular hypertrophy.  Left ventricular diastolic parameters  are consistent with Grade I diastolic dysfunction (impaired relaxation).   2. Right ventricular systolic function is normal. The right ventricular  size is normal. Tricuspid regurgitation signal is inadequate for assessing  PA pressure.   3. Right atrial size was mildly dilated.   4. The mitral valve is abnormal. No evidence of mitral valve  regurgitation. No evidence of mitral stenosis. Moderate mitral annular  calcification.    5. The aortic valve was not well visualized. There is moderate  calcification of the aortic valve. Aortic valve regurgitation is not  visualized. Mild to moderate aortic valve stenosis. Aortic valve area, by  VTI measures 1.42 cm. Aortic valve mean  gradient measures 16.2 mmHg. Aortic valve Vmax measures 2.63 m/s.   6. The inferior vena cava is normal in size but collapsibility could not  be evaluated.   . Lipid Panel    Component Value Date/Time   CHOL 157 05/16/2021 0830   TRIG 131 05/16/2021 0830   HDL 37 (L) 05/16/2021 0830   CHOLHDL 4.2 05/16/2021 0830   CHOLHDL 3.7 05/05/2019 0916   VLDL 19 05/05/2019 0916   LDLCALC 96 05/16/2021 0830      Wt Readings from Last 3 Encounters:  05/26/24 263 lb (119.3 kg)  06/11/23 262 lb 6.4 oz (119 kg)  06/01/23 258 lb 6.4 oz (117.2 kg)      ASSESSMENT AND PLAN:  1  Dyspnea. Pt with long hx of DOE Has been relatively stable  with walking 200 feet   Stable    PFTs did have decreased DLCO (moderate)Pt with recent admit for SOB   Rx with lasix    Pt denies CP    Echo with mild to mod AS   Follow up in July 2026     2  Aortic stenosis  Echo in Jan 2025 showed AV was stable  Mild to mod stenosis Murmur is mid peaking   Repeat in July 2026  3  CV dz   OCt 2024  Moderate CV dz on RICA   Mild dz LICA     Has appt for [facility] Jan    4 HL Did not tolerate statins  Excellent LDL control on Repatha    LDL 29  Continue  Trig are high at 199 reflecting carb intake    5   Dizziness  Denies     6  Metabolics   A1C 6.2  PT loves cereal  Needs to cut back      Current medicines are reviewed at length with the patient today.  The patient does not have concerns regarding medicines.  Signed, Vina Gull, MD  05/26/2024 10:54 AM    [facility] [address] [place], [place], [place]  [address] Phone: [phone]; Fax: [phone]

## 2024-05-26 ENCOUNTER — Ambulatory Visit: Attending: Internal Medicine | Admitting: Internal Medicine

## 2024-05-26 ENCOUNTER — Other Ambulatory Visit (HOSPITAL_COMMUNITY): Payer: Self-pay

## 2024-05-26 ENCOUNTER — Encounter: Payer: Self-pay | Admitting: Internal Medicine

## 2024-05-26 VITALS — BP 146/73 | HR 60 | Ht 72.0 in | Wt 263.0 lb

## 2024-05-26 DIAGNOSIS — I350 Nonrheumatic aortic (valve) stenosis: Secondary | ICD-10-CM

## 2024-05-26 MED ORDER — REPATHA SURECLICK 140 MG/ML ~~LOC~~ SOAJ
140.0000 mg | SUBCUTANEOUS | 3 refills | Status: AC
  Filled 2024-06-02: qty 6, 84d supply, fill #0

## 2024-05-26 NOTE — Patient Instructions (Signed)
 Medication Instructions:  Your physician recommends that you continue on your current medications as directed. Please refer to the Current Medication list given to you today.  *If you need a refill on your cardiac medications before your next appointment, please call your pharmacy*  Lab Work: NONE   If you have labs (blood work) drawn today and your tests are completely normal, you will receive your results only by: MyChart Message (if you have MyChart) OR A paper copy in the mail If you have any lab test that is abnormal or we need to change your treatment, we will call you to review the results.  Testing/Procedures: Your physician has requested that you have an echocardiogram. Echocardiography is a painless test that uses sound waves to create images of your heart. It provides your doctor with information about the size and shape of your heart and how well your heart's chambers and valves are working. This procedure takes approximately one hour. There are no restrictions for this procedure. Please do NOT wear cologne, perfume, aftershave, or lotions (deodorant is allowed). Please arrive 15 minutes prior to your appointment time.  Please note: We ask at that you not bring children with you during ultrasound (echo/ vascular) testing. Due to room size and safety concerns, children are not allowed in the ultrasound rooms during exams. Our front office staff cannot provide observation of children in our lobby area while testing is being conducted. An adult accompanying a patient to their appointment will only be allowed in the ultrasound room at the discretion of the ultrasound technician under special circumstances. We apologize for any inconvenience.   Follow-Up: At [facility], you and your health needs are our priority.  As part of our continuing mission to provide you with exceptional heart care, our providers are all part of one team.  This team includes your primary Cardiologist  (physician) and Advanced Practice Providers or APPs (Physician Assistants and Nurse Practitioners) who all work together to provide you with the care you need, when you need it.  Your next appointment:    July   Provider:   Vina Gull, MD    We recommend signing up for the patient portal called MyChart.  Sign up information is provided on this After Visit Summary.  MyChart is used to connect with patients for Virtual Visits (Telemedicine).  Patients are able to view lab/test results, encounter notes, upcoming appointments, etc.  Non-urgent messages can be sent to your provider as well.   To learn more about what you can do with MyChart, go to [email or web address].   Other Instructions Thank you for choosing [facility]!

## 2024-05-27 ENCOUNTER — Other Ambulatory Visit (HOSPITAL_COMMUNITY): Payer: Self-pay

## 2024-06-02 ENCOUNTER — Other Ambulatory Visit (HOSPITAL_COMMUNITY): Payer: Self-pay

## 2024-06-25 ENCOUNTER — Other Ambulatory Visit: Payer: Self-pay

## 2024-06-25 DIAGNOSIS — I6521 Occlusion and stenosis of right carotid artery: Secondary | ICD-10-CM

## 2024-07-21 NOTE — Progress Notes (Unsigned)
 " Office Note     CC:  follow up Requesting Provider:  Shona Norleen PEDLAR, MD  HPI: Adam Banks is a 89 y.o. ([birth year]) male who presents for surveillance follow up of carotid artery stenosis. He has had known asymptomatic right ICA stenosis. He has no history of TIA or stroke.   Today he is here with his daughter in law. He is very hard of hearing and does not wear a hearing aid. Overall he says he is doing really well for an old man. He denies any visual changes, slurred speech, facial drooping or unilateral upper or lower extremity weakness or numbness. He has been having a lot of residual joint pain from taking statins. He has been off the statin for almost a year but still having symptoms. Otherwise he says he walks without any issues. He is a little limited in his ambulation secondary to getting short of breath. This has been the case since he had pneumonia in 2024. He does not have any claudication, rest pain or tissue loss. He is medically managed on Aspirin  and Repatha   Past Medical History:  Diagnosis Date   Enlarged prostate 2014   History of urinary hesitancy    Hypertension     Past Surgical History:  Procedure Laterality Date   HYDROCELE EXCISION Left 05/05/2014   Procedure: LEFT HYDROCELECTOMY ADULT;  Surgeon: Garnette CHRISTELLA Shack, MD;  Location: [facility];  Service: Urology;  Laterality: Left;   INGUINAL HERNIA REPAIR Right 1977   TONSILLECTOMY      Social History   Socioeconomic History   Marital status: Married    Spouse name: Not on file   Number of children: Not on file   Years of education: Not on file   Highest education level: Not on file  Occupational History   Not on file  Tobacco Use   Smoking status: Never    Passive exposure: Never   Smokeless tobacco: Never  Vaping Use   Vaping status: Never Used  Substance and Sexual Activity   Alcohol use: No   Drug use: No   Sexual activity: Not on file  Other Topics Concern   Not on file  Social History  Narrative   Not on file   Social Drivers of Health   Tobacco Use: Low Risk (07/23/2024)   Patient History    Smoking Tobacco Use: Never    Smokeless Tobacco Use: Never    Passive Exposure: Never  Financial Resource Strain: Not on file  Food Insecurity: Not on file  Transportation Needs: Not on file  Physical Activity: Not on file  Stress: Not on file  Social Connections: Not on file  Intimate Partner Violence: Not on file  Depression (EYV7-0): Not on file  Alcohol Screen: Not on file  Housing: Not on file  Utilities: Not on file  Health Literacy: Not on file   Family History  Problem Relation Age of Onset   Hodgkin's lymphoma Mother    Heart attack Father     Current Outpatient Medications  Medication Sig Dispense Refill   alfuzosin  (UROXATRAL ) 10 MG 24 hr tablet TAKE 1 TABLET EVERY EVENING 90 tablet 3   aspirin  EC 81 MG EC tablet Take 1 tablet (81 mg total) by mouth daily. Swallow whole. 30 tablet 0   Evolocumab  (REPATHA  SURECLICK) 140 MG/ML SOAJ Inject 140 mg into the skin every 14 (fourteen) days. 6 mL 3   ezetimibe  (ZETIA ) 10 MG tablet Take 1 tablet by mouth once daily (Patient  not taking: Reported on 05/26/2024) 90 tablet 1   finasteride  (PROSCAR ) 5 MG tablet TAKE 1 TABLET EVERY DAY 90 tablet 3   fluticasone  (FLONASE ) 50 MCG/ACT nasal spray Place 1 spray into both nostrils 2 (two) times daily. 9.9 mL 0   FLUZONE HIGH-DOSE 0.5 ML injection Inject 0.5 mLs into the muscle once.     furosemide  (LASIX ) 40 MG tablet Take 1 tablet by mouth once daily 90 tablet 1   levocetirizine (XYZAL) 5 MG tablet Take 5 mg by mouth every evening.     losartan  (COZAAR ) 25 MG tablet Take 1 tablet (25 mg total) by mouth 2 (two) times daily. 180 tablet 3   naproxen sodium (ALEVE) 220 MG tablet Take 220 mg by mouth daily as needed (pain).     potassium chloride  SA (KLOR-CON  M20) 20 MEQ tablet Take 1 tablet by mouth once daily 90 tablet 3   PREVNAR 20 0.5 ML injection Inject 0.5 mLs into the  muscle once.     sodium chloride  (OCEAN) 0.65 % SOLN nasal spray Place 1 spray into both nostrils as needed for congestion. 15 mL 0   No current facility-administered medications for this visit.    Allergies[1]   REVIEW OF SYSTEMS:  [X]  denotes positive finding, [ ]  denotes negative finding Cardiac  Comments:  Chest pain or chest pressure:    Shortness of breath upon exertion:    Short of breath when lying flat:    Irregular heart rhythm:        Vascular    Pain in calf, thigh, or hip brought on by ambulation:    Pain in feet at night that wakes you up from your sleep:     Blood clot in your veins:    Leg swelling:         Pulmonary    Oxygen at home:    Productive cough:     Wheezing:         Neurologic    Sudden weakness in arms or legs:     Sudden numbness in arms or legs:     Sudden onset of difficulty speaking or slurred speech:    Temporary loss of vision in one eye:     Problems with dizziness:         Gastrointestinal    Blood in stool:     Vomited blood:         Genitourinary    Burning when urinating:     Blood in urine:        Psychiatric    Major depression:         Hematologic    Bleeding problems:    Problems with blood clotting too easily:        Skin    Rashes or ulcers:        Constitutional    Fever or chills:      PHYSICAL EXAMINATION:  Vitals:   07/23/24 1456 07/23/24 1458  BP: (!) 165/75 138/80  Pulse: 60 65  Temp: 97.8 F (36.6 C)   TempSrc: Temporal   Weight: 257 lb 4.8 oz (116.7 kg)     General:  WDWN in NAD; vital signs documented above Gait: Normal HENT: WNL, normocephalic Pulmonary: normal non-labored breathing Cardiac: regular HR, without  Murmurs with carotid bruit on right Abdomen: soft, Vascular Exam/Pulses: 2+ radial, 2+ AT pulses bilaterally Extremities: without ischemic changes, without Gangrene , without cellulitis; without open wounds;  Musculoskeletal: no muscle wasting or atrophy  Neurologic:  A&O X  3 Psychiatric:  The pt has Normal affect.   Non-Invasive Vascular Imaging:   VAS US  Carotid Duplex: Summary:  Right Carotid: Velocities in the right ICA are consistent with a 40-59% stenosis. Unable to replicate velocities from previous study, likely secondary to posterior shadowing.   Left Carotid: Velocities in the left ICA are consistent with a 1-39% stenosis.   Vertebrals: Bilateral vertebral arteries demonstrate antegrade flow.  Subclavians: Normal flow hemodynamics were seen in bilateral subclavian arteries.    ASSESSMENT/PLAN:: 89 y.o. male here for follow up for carotid artery stenosis. He remains without any associated symptoms. Duplex was unable to replicate same velocities as prior duplex. Today right ICA is in the 40-59% stenosis range (previously 60-79%). Left ICA 1-39%. Normal flow in the vertebral and subclavian arteries - Continue Aspirin  and Repatha  - reviewed signs and symptoms of TIA and stroke with him and he understands should this occur to seek immediate medical attention - He will follow up in 1 year with repeat carotid duplex   Teretha Damme, PA-C [facility] [phone]  Clinic MD:   Sheree     [1]  Allergies Allergen Reactions   Flomax [Tamsulosin Hcl] Other (See Comments)    Patient states that he passed out.   "

## 2024-07-23 ENCOUNTER — Encounter: Payer: Self-pay | Admitting: Physician Assistant

## 2024-07-23 ENCOUNTER — Ambulatory Visit (HOSPITAL_COMMUNITY)
Admission: RE | Admit: 2024-07-23 | Discharge: 2024-07-23 | Disposition: A | Discharge status: Home / Self Care | Source: Ambulatory Visit | Attending: Vascular Surgery | Admitting: Vascular Surgery

## 2024-07-23 ENCOUNTER — Ambulatory Visit: Admitting: Physician Assistant

## 2024-07-23 VITALS — BP 138/80 | HR 65 | Temp 97.8°F | Wt 257.3 lb

## 2024-07-23 DIAGNOSIS — I6521 Occlusion and stenosis of right carotid artery: Secondary | ICD-10-CM | POA: Insufficient documentation

## 2024-07-31 ENCOUNTER — Other Ambulatory Visit: Payer: Self-pay | Admitting: Internal Medicine

## 2025-01-08 ENCOUNTER — Other Ambulatory Visit (HOSPITAL_COMMUNITY)

## 2025-05-11 ENCOUNTER — Ambulatory Visit: Admitting: Urology
